# Patient Record
Sex: Female | Born: 1970 | Race: White | Hispanic: No | Smoking: Never smoker
Health system: Southern US, Community
[De-identification: ages and names within clinical notes are randomized; demographics above are authoritative.]

## PROBLEM LIST (undated history)

## (undated) DIAGNOSIS — N760 Acute vaginitis: Secondary | ICD-10-CM

## (undated) DIAGNOSIS — E039 Hypothyroidism, unspecified: Secondary | ICD-10-CM

## (undated) DIAGNOSIS — B9689 Other specified bacterial agents as the cause of diseases classified elsewhere: Secondary | ICD-10-CM

## (undated) DIAGNOSIS — T8859XA Other complications of anesthesia, initial encounter: Secondary | ICD-10-CM

## (undated) DIAGNOSIS — T4145XA Adverse effect of unspecified anesthetic, initial encounter: Secondary | ICD-10-CM

## (undated) DIAGNOSIS — N898 Other specified noninflammatory disorders of vagina: Secondary | ICD-10-CM

## (undated) DIAGNOSIS — F419 Anxiety disorder, unspecified: Secondary | ICD-10-CM

## (undated) HISTORY — PX: DILATION AND CURETTAGE OF UTERUS: SHX78

## (undated) HISTORY — DX: Acute vaginitis: N76.0

## (undated) HISTORY — PX: OTHER SURGICAL HISTORY: SHX169

## (undated) HISTORY — PX: ABDOMINAL HYSTERECTOMY: SHX81

## (undated) HISTORY — PX: TUBAL LIGATION: SHX77

## (undated) HISTORY — DX: Anxiety disorder, unspecified: F41.9

## (undated) HISTORY — DX: Other specified bacterial agents as the cause of diseases classified elsewhere: B96.89

## (undated) HISTORY — PX: ABDOMINAL HYSTERECTOMY: SUR658

## (undated) HISTORY — DX: Other specified noninflammatory disorders of vagina: N89.8

---

## 2001-09-21 ENCOUNTER — Other Ambulatory Visit: Admission: RE | Admit: 2001-09-21 | Discharge: 2001-09-21 | Payer: Self-pay | Admitting: Obstetrics and Gynecology

## 2001-10-05 ENCOUNTER — Ambulatory Visit (HOSPITAL_COMMUNITY): Admission: RE | Admit: 2001-10-05 | Discharge: 2001-10-05 | Payer: Self-pay | Admitting: Obstetrics & Gynecology

## 2001-10-28 ENCOUNTER — Inpatient Hospital Stay (HOSPITAL_COMMUNITY): Admission: RE | Admit: 2001-10-28 | Discharge: 2001-10-31 | Payer: Self-pay | Admitting: Obstetrics & Gynecology

## 2002-12-23 ENCOUNTER — Inpatient Hospital Stay (HOSPITAL_COMMUNITY): Admission: RE | Admit: 2002-12-23 | Discharge: 2002-12-26 | Payer: Self-pay | Admitting: Obstetrics & Gynecology

## 2002-12-23 ENCOUNTER — Encounter: Payer: Self-pay | Admitting: Obstetrics & Gynecology

## 2003-04-19 ENCOUNTER — Inpatient Hospital Stay (HOSPITAL_COMMUNITY): Admission: RE | Admit: 2003-04-19 | Discharge: 2003-04-25 | Payer: Self-pay | Admitting: Obstetrics & Gynecology

## 2006-12-24 ENCOUNTER — Ambulatory Visit (HOSPITAL_COMMUNITY): Admission: RE | Admit: 2006-12-24 | Discharge: 2006-12-24 | Payer: Self-pay | Admitting: Obstetrics & Gynecology

## 2007-02-23 ENCOUNTER — Inpatient Hospital Stay (HOSPITAL_COMMUNITY): Admission: RE | Admit: 2007-02-23 | Discharge: 2007-02-25 | Payer: Self-pay | Admitting: Obstetrics & Gynecology

## 2007-02-23 ENCOUNTER — Encounter: Payer: Self-pay | Admitting: Obstetrics & Gynecology

## 2011-01-22 NOTE — H&P (Signed)
NAME:  Kendra Knight, Kendra Knight NO.:  [id number]   MEDICAL RECORD NO.:  [id number]          PATIENT TYPE:  AMB   LOCATION:  DAY                           FACILITY:  [facility]   PHYSICIAN:  Lazaro Arms, M.D.   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  The  patient has a very interesting history dating back to January of 2003,  at which time she was pregnant and had a miscarriage, and I found she  had a large posterior uterine mass after that evaluation concluded.  I  did a laparoscopy and there was found to be a smooth muscle tumor or a  specifically spindle cell tumor arising from the uterosacral ligament  posteriorly.  I talked with Dr. Stanford Breed on several occasions at  that time and subsequently and had it re-looked at in biopsy, and it was  found to be benign with no malignant issues.  She subsequently got  pregnant again, and during the pregnancy a new mass began to grow.  We  did a cesarean section on December 23, 2002 because of an obstructive birth  canal because of this posterior mass.  Her surgery was uncomplicated at  that time.  We did do a tubal.  Her only postoperative problem was  respiratory suppression with IV narcotics necessitating admission to the  ICU for a few hours for respiratory support, but the mass continued to  stay large even after delivery.  As a result, we took her back in August  of 2004 and did a hysterectomy, again, under the consultation of Dr.  Stanford Breed.  Her sigmoid colon at that time was densely adherent to  it, and had to call Dr. Katrinka Blazing in even after a bowel prep, and she had a  small injury of the sigmoid which was repaired without difficulty.  Her  postoperative course was essentially unremarkable, except for a  bowel  decompression requirement.  Again, pathology returned as benign.  I saw  the patient again in April, at which time she stated she had been having  more abdominal pain, and on exam she had what I thought was about a 10 x  10 cm central abdominal mass arising from the pelvis, extending up into  the upper abdomen.  I did a CT scan which revealed a 14.7 x 9.8 x 9.8 cm  mass consistent with a recurrent benign spindle cell tumor.  There was  no evidence of malignancy.  I talked to Dr. Stanford Breed again, and he  thought a possibility was it could be hormonally response, and so he  recommended doing Lupron to see if it would shrink preoperatively, to  see if there was any reason to take ovaries out.  [name] is very opposed  to removing the ovaries, and, as a result of that, it really has not  responded, at least on the  first shot to Lupron.  We will see what it  looks like in the operating room at that time.  The patient also  understands that there is a limit to the number of surgeries that she  can have for this recurrence, and we may indeed have her see Dr. Kemper Durie-  Sharol Given.  She was adamant about not wanting to have the surgery done  anywhere else at this time, even though she knows there is a big chance  of having a bowel injury as a result of that.  She is bowel prepped  preoperatively, and Dr. Lovell Sheehan is going to be in the surgery with me  for that reason.   PAST MEDICAL HISTORY:  Negative except for the recurrent spindle cell  tumor.   PAST SURGERY:  1. She had a D&C in 1995.  2. Cryotherapy in 1993.  3. Laparoscopy in 2003 as stated above.  4. D&C in February of 2003 for the miscarriage.  5. Cesarean section in February of 2004 with tubal ligation.  6. Exploratory laparotomy with hysterectomy, excision of spindle cell      tumor, repair of bowel injury in August of 2004.   PAST OBSTETRIC HISTORY:  She has had 2 vaginal deliveries, a cesarean  section and 2 pregnancy  losses.   ALLERGIES:  1. CODEINE.  2. HYDROCODONE.  3. DARVOCET.  4. PENICILLIN.  5. STADOL.  6. ERYTHROMYCIN.  7. BUPRENEX caused respiratory depression.   CURRENT MEDICATIONS:  None, except for the Lupron on board, and she is  taking Effexor.  I have tried to ward off the perimenopausal symptoms  that would occur with it.  She has taken 150 mg a day.   REVIEW OF SYSTEMS:  Abdominal pain.  Otherwise negative.   PHYSICAL EXAMINATION:  HEENT:  Unremarkable.  Thyroid is normal.  LUNGS:  Clear.  HEART:  Regular rate and rhythm without murmurs, rubs, or gallops.  BREASTS:  Without mass, discharge or skin changes.  ABDOMEN:  A definitive mass you can palpate exteriorly up to the  umbilicus.  It is mobile.  PELVIC:  The pelvic exam again reveals this mass.  Adnexa is negative.  EXTREMITIES:  Warm with no edema.  NEUROLOGIC:  Grossly intact.   ASSESSMENT:  1. Recurrent spindle cell tumor.  2. Probable adherence to small bowel and large bowel with high chance      of possible need for significant bowel surgery intraoperatively.   PLAN:  The patient understands the risk that this incurs with coming  back to the OR again.  We did offer her to go ahead and have Dr. Stanford Breed operate on her.  She declined at this point.  As a result, I  have got Dr. Lovell Sheehan coming in.  We are doing a preoperative bowel prep  in anticipation of probable bowel injury due to this large recurrent  mass.  The patient understands those risks, and she will proceed.      Lazaro Arms, M.D.  Electronically Signed     LHE/MEDQ  D:  02/23/2007  T:  02/23/2007  Job:  [id number]

## 2011-01-22 NOTE — Op Note (Signed)
NAMEMERRICK, FEUTZ NO.:  [id number]   MEDICAL RECORD NO.:  [id number]          PATIENT TYPE:  INP   LOCATION:  A427                          FACILITY:  [facility]   PHYSICIAN:  Lazaro Arms, M.D.   DATE OF BIRTH:  16-Feb-1971   DATE OF PROCEDURE:  02/23/2007  DATE OF DISCHARGE:  02/25/2007                               OPERATIVE REPORT   PREOPERATIVE DIAGNOSES:  1. Recurrent pelvic mass, spindle cell tumor.  2. Previous severe adhesions with bowel injury to sigmoid.   POSTOPERATIVE DIAGNOSES:  1. Recurrent pelvic mass, spindle cell tumor.  2. Previous severe adhesions with bowel injury to sigmoid.  3. 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   SURGEON:  Eure.   ASSISTANT:  Jenkins.   FINDINGS:  Patient had had 3 surgeries in the past for recurrent spindle  cell tumors.  It was assumed this was a recurrence.  She had had her  last one 4 years ago.  The tumors had been found to be benign.  I talked  to Dr. Stanford Breed a couple of months prior to surgery.  We decided  to try Lupron and see if it would shrink it, it did not.  At time of  surgery today she had, it was really a cystic mass and it was full of  mucin.  Frozen section confirmed that but also confirmed spindle cell  tumor at the base of it; so, I am unsure about the final pathology.  I  reviewed the frozen with the pathologist.  Otherwise, there was no bowel  involvement today, it was retroperitoneal.   DESCRIPTION OF OPERATION:  The patient was taken to the operating room,  placed in the supine position, underwent general endotracheal  anesthesia, prepped and draped in the usual sterile fashion.  A  Pfannenstiel skin incision was made, carried down sharply to the rectus  fascia.  It was scored in the midline, extended  laterally.  The fascia  was taken off of the muscle superiorly and inferiorly without  difficulty.  The muscles were divided, peritoneal cavity was entered.  Dr. Lovell Sheehan and I just did manual retraction.  The mass was identified.  A lot of sharp and blunt dissection was performed to isolate the mass.  We identified both ureters, it took a great deal of time in tracking  them all the way down into the bladder, identified bowel and adhesions  of it to the bowel.  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  It  appeared to be mucin anyway and I sent that off for cytology.  There  really was no blood supply to this mass that we could find.  We  basically just took it off the vaginal cuff and then  I closed the cuff  back.  Again, we made sure the ureters were uninjured and no bowel  injury had occurred during the procedure either.  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  We reapproximated the  muscles and peritoneum, closed the fascia.  We made sure the subcu was  hemostatic and closed the skin with skin staples.  The patient tolerated  the procedure well.  She experienced 200 mL of blood loss.  Taken to the  recovery room in good stable condition.  All counts correct.      Lazaro Arms, M.D.  Electronically Signed     LHE/MEDQ  D:  04/03/2007  T:  04/03/2007  Job:  [id number]

## 2011-01-25 NOTE — H&P (Signed)
   NAME:  COURTENAY, HIRTH                           ACCOUNT NO.:  [id number]   MEDICAL RECORD NO.:  [id number]                   PATIENT TYPE:  AMB   LOCATION:  DAY                                  FACILITY:  [facility]   PHYSICIAN:  Lazaro Arms, M.D.                DATE OF BIRTH:  1971-07-19   DATE OF ADMISSION:  12/23/2002  DATE OF DISCHARGE:                                HISTORY & PHYSICAL   HISTORY OF PRESENT ILLNESS:  The patient is a 40 year old white female,  gravida 5, para 2, miscarriage 2, and living two children, with an estimated  date of delivery of January 05, 2003, currently at   DICTATION ENDED AT THIS POINT.                                               Lazaro Arms, M.D.    Loraine Maple  D:  12/22/2002  T:  12/23/2002  Job:  [id number]

## 2011-01-25 NOTE — H&P (Signed)
NAME:  Kendra Knight, Kendra Knight                           ACCOUNT NO.:  [id number]   MEDICAL RECORD NO.:  [id number]                   PATIENT TYPE:  AMB   LOCATION:  DAY                                  FACILITY:  [facility]   PHYSICIAN:  Lazaro Arms, M.D.                DATE OF BIRTH:  Aug 23, 1971   DATE OF ADMISSION:  12/23/2002  DATE OF DISCHARGE:                                HISTORY & PHYSICAL   HISTORY OF PRESENT ILLNESS:  The patient is a 40 year old white female,  gravida 5, para 2, abortus 2, with an estimated date of delivery of January 05, 2003 by last menstrual period and confirmatory 6-week sonogram,  currently at 38-1/7 weeks' gestation, who is admitted for a primary cesarean  section because of breech presentation.  To give complete background, the  patient underwent a miscarriage with a D&C approximately one year ago and a  short time thereafter was found to have a large posterior uterine mass which  turned out to be a cellular myoma of unknown potential.  She then got  pregnant again without difficulty.  That mass was removed laparoscopically,  but had to actually be taken out of the abdominal cavity through a small  lower incision after a failed posterior colpotomy.  She became pregnant and  pregnancy has progressed without difficulty, however, she has developed  another mass which I believe to be anterior -- it appears to be on  ultrasound and exam -- which is actually blocking the birth canal.  The baby  is in sort of a back-down/transverse oblique lie and I cannot even feel the  cervix, I think not giving any hope for a vaginal delivery; as a result, she  is admitted for a primary cesarean section.  The myoma was in fact arising  from the uterosacral ligament, posterior to the uterus -- it was not  attached to the uterus at all -- and that is why she otherwise would have  been a candidate for a vaginal delivery because no uterine incision was  made.   PAST MEDICAL HISTORY:   Past medical history significant only for cervical  dysplasia in the distant past.   PAST SURGICAL HISTORY:  She had a D&C in 1995, cryotherapy in 1993.  She had  the laparoscopy in February of last year.  She had the D&C also in February  of last year.   PAST OBSTETRICAL HISTORY:  Two pregnancy losses and two vaginal deliveries.   REVIEW OF SYSTEMS:  Review of systems otherwise negative.   ALLERGIES:  Her allergies are to CODEINE, HYDROCODONE, DARVOCET, PENICILLIN,  STADOL and ERYTHROMYCIN.   CURRENT MEDICATIONS:  Prenatal vitamins and occasional Fioricet for  headaches.   PHYSICAL EXAMINATION:  HEENT:  Unremarkable.  NECK:  Thyroid is normal.  LUNGS:  Lungs are clear.  HEART:  Heart has regular rhythm without murmurs, regurgitation or gallops.  BREASTS:  Deferred.  ABDOMEN:  Fundal height of 41 cm.  Cervix cannot be palpated.  The abdomen  is otherwise benign.  EXTREMITIES:  Extremities are warm with trace edema.  NEUROLOGIC:  Exam is grossly intact.   IMPRESSION:  1. Intrauterine pregnancy at 38-2/7 weeks' gestation.  2. Large anterior myoma.  3. Non-vertex oblique presentation.   PLAN:  The patient is admitted for a primary cesarean section and bilateral  tubal ligation at her request.  I told her that we probably would not remove  the myoma but, however, that if it was pedunculated or easily removed, that  it was a possibility, but that would be surgery-time decision.  If it is at  all embedded in the uterus, I told her that it is not appropriate to remove  it at the time of cesarean section because of blood supply issues and she  understands that.  She also understands the permanent nature of  sterilization.                                                Lazaro Arms, M.D.    Loraine Maple  D:  12/22/2002  T:  12/23/2002  Job:  [id number]

## 2011-01-25 NOTE — H&P (Signed)
[facility]  Patient:    Kendra Knight, Kendra Knight Visit Number: [id number] MRN: [id number]          Service Type: DSU Location: DAY Attending Physician:  Lazaro Arms Dictated by:   Duane Lope, M.D. Admit Date:  10/05/2001                           History and Physical  DATE OF BIRTH:  03-15-2071  HISTORY OF PRESENT ILLNESS:  The patient is a 40 year old white female, gravida 4, para 2, abortus 1, who states that she had a last menstrual period of July 15, 2001, which would give an estimated date of delivery of April 21, 2002.  She came in for a visit on September 08, 2001, and was found to only be 5 weeks and 6 days by that ultrasound, giving a delivery date of June 05, 2002.  Subsequently two weeks later on September 21, 2001, she had another sonogram just to confirm and she had a viable pregnancy consistent with 7 weeks and 5 days with positive fetal cardiac activity.  She had been doing well without any complaints.  She called me yesterday on call and stated that she had some pink discharge which was very minimal and she did not have any more.  She had a little bit more this morning.  We decided to proceed with a vaginal sonogram.  A vaginal probe sonogram was performed and revealed a shrinking embryonic pole down to 0.6 cm or 6 weeks and 4 days, irregular sac, no cardiac activity, and no yolk sac seen.  It was compared to her sonogram two weeks ago and it is certainly much smaller.  The impression is that this is a missed AB.  PAST MEDICAL HISTORY:  Significant only for some pregnancy-induced hypertension.  PAST SURGICAL HISTORY:  Negative.  PAST OBSTETRICAL HISTORY:  Two vaginal deliveries and one miscarriage.  ALLERGIES:  PENICILLIN, CODEINE, HYDROCODONE, DARVOCET, STADOL, and ERYTHROMYCIN.  MEDICATIONS:  Her only medication is prenatal vitamins.  SOCIAL HISTORY:  She is married and a homemaker.  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  REVIEW OF SYSTEMS:  Otherwise negative.  FAMILY HISTORY:  Noncontributory.  PHYSICAL EXAMINATION:  Her weight today is 151 pounds.  VITAL SIGNS:  The blood pressure is 110/60.  HEENT:  Unremarkable.  NECK:  The thyroid is normal.  LUNGS:  Clear.  HEART:  Regular rate and rhythm without murmurs, rubs, or gallops.  BREASTS:  Deferred.  ABDOMEN:  Benign.  No hepatosplenomegaly or masses.  Nontender.  PELVIC:  As per the vaginal probe sonogram results above.  EXTREMITIES:  Warm with no edema.  NEUROLOGIC:  Exam is grossly intact.  IMPRESSION:  Missed abortion at 9 weeks and 5 days, 7 weeks and 6 days size.  PLAN:  The patient is admitted for an outpatient suction and sharp uterine curettage and cervical dilatation for uterine evacuation of this miscarriage. The patient understands the risks and benefits and will proceed. Dictated by:   Duane Lope, M.D. Attending Physician:  Lazaro Arms DD:  10/05/01 TD:  10/05/01 Job: [id number]

## 2011-01-25 NOTE — Op Note (Signed)
[facility]  Patient:    Kendra Knight, Kendra Knight Visit Number: [id number] MRN: [id number]          Service Type: DSU Location: DAY Attending Physician:  Lazaro Arms Dictated by:   Duane Lope, M.D. Proc. Date: 10/05/01 Admit Date:  10/05/2001                             Operative Report  PREOPERATIVE DIAGNOSIS:  Missed abortion, first trimester.  POSTOPERATIVE DIAGNOSIS:  Missed abortion, first trimester.  OPERATION: 1. Cervical dilatation. 2. Suction uterine curettage. 3. Sharp uterine curettage.  SURGEON:  Duane Lope, M.D.  ANESTHESIA:  Mask.  FINDINGS:  The patient had been seen in the office earlier in the day and was found to have a nonviable pregnancy in the first trimester.  She would have been 9 weeks 5 days by dates based on a sonogram on 31 December; however, there was no fetal cardiac activity where there had been two weeks ago, and the pregnancy itself, the embryonic pole had actually shrunk.  DESCRIPTION OF PROCEDURE:  The patient was taken to the operating room and placed in the supine position where she underwent IV sedation.  She was then given propofol IV and given anesthesia by mask.  She was placed in the dorsolithotomy position.  The vagina and perineum were prepped using Betadine. The patient was draped in the usual sterile fashion.  Her bladder was drained with a red rubber catheter.  Speculum was placed, and the cervix was grasped using single-tooth tenaculum.  Hegar dilators were used, and the cervix was dilated serially without difficulty to allow the passage of a #8 curved suction curet.  Three passes were made with suction curet gently in a twisting manner, and all the tissue was removed with gentle sharp curettage using a #4 curet.  One more pass of suction curet was used, and all of this was done gently.  There was acceptable bleeding at the end of the case.  The patient was given methergine 0.2 mg IM.  The  single-tooth tenaculum was taken out. The speculum was removed.  The patient was stabilized and taken to the recovery room in good and stable condition.  She will be discharged home from the recovery room and followed up in the office in two weeks. Dictated by:   Duane Lope, M.D. Attending Physician:  Lazaro Arms DD:  10/05/01 TD:  10/05/01 Job: [id number]

## 2011-01-25 NOTE — H&P (Signed)
[facility]  Patient:    Kendra Knight, Kendra Knight Visit Number: [id number] MRN: [id number]          Service Type: DSU Location: DAY Attending Physician:  Lazaro Arms Dictated by:   Turner Daniels, M.D. Admit Date:  10/05/2001 Discharge Date: 10/05/2001                           History and Physical  HISTORY OF PRESENT ILLNESS:  Patient is a 40 year old white female gravida 4, para 2, abortus 2 who underwent a D&C for a missed abortion three weeks ago today.  Over the past week and a half or so has had increasing problems with pelvic pain, especially on the left side radiating up her left flank.  She had been seen in the office on the 10th and was complaining of some pain, but really nothing worse.  She is fairly stoic and I did not do a pelvic examination today because I had just done one a few days earlier.  Yesterday on pelvic examination she had what felt like a posterior cul-de-sac mass and a vaginal probe sonogram reveals about a 5.5 cm mass that looks hemorrhagic with some free fluid.  I cannot tell if it is coming from the left ovary but her symptoms are certainly from the left ovary.  Her endometrial stripe looks normal and the uterus otherwise looks normal.  As a result she is admitted for diagnostic laparoscopy and indicated procedures.  PAST MEDICAL HISTORY:  Significant only for pregnancy induced hypertension.  PAST SURGICAL HISTORY:  D&C.  PAST OBSTETRICAL HISTORY:  Two vaginal deliveries and now two miscarriages.  ALLERGIES:  PENICILLIN, CODEINE, HYDROCODONE, DARVOCET, STADOL, ERYTHROMYCIN.  MEDICATIONS:  Prenatal vitamins.  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  REVIEW OF SYSTEMS:  Otherwise negative.  FAMILY HISTORY:  Noncontributory.  PHYSICAL EXAMINATION  VITAL SIGNS:  Weight 150 pounds, blood pressure 100/60.  HEENT:  Unremarkable.  NECK:  Normal thyroid.  LUNGS:   Clear.  HEART:  Regular rate and rhythm without murmur, regurgitation, or gallop.  BREASTS:  Deferred.  ABDOMEN:  Benign except pain in the left lower quadrant.  No rebound.  No voluntary guarding.  No masses.  PELVIC:  As per the HPI.  EXTREMITIES:  Warm.  No edema.  NEUROLOGIC:  Grossly intact.  IMPRESSION:  Pelvic mass questionably coming from the left ovary, questionably a hemorrhagic corpus luteum of pregnancy.  PLAN:  Patient is admitted for a diagnostic laparoscopy indicated procedure. She understands the risks, benefits, alternatives, and will proceed. Dictated by:   Turner Daniels, M.D. Attending Physician:  Lazaro Arms DD:  10/28/01 TD:  10/28/01 Job: [id number]

## 2011-01-25 NOTE — Discharge Summary (Signed)
[facility]  Patient:    Kendra Knight, Kendra Knight Visit Number: [id number] MRN: [id number]          Service Type: MED Location: 4A A427 01 Attending Physician:  Lazaro Arms Dictated by:   Duane Lope, M.D. Admit Date:  10/28/2001 Discharge Date: 10/31/2001                             Discharge Summary  DISCHARGE DIAGNOSES: 1. Status post exploratory laparotomy after an operative laparoscopy    for removal of a pelvic mass, currently being read out as a pelvic    fibroma but with special stains and second opinion pending. 2. Unremarkable postoperative course.  PROCEDURE: 1. Operative laparoscopy with excision of pelvic mass. 2. Exploratory laparotomy in order to remove it from the abdomen. 3. Failed posterior colpotomy.  Please refer to the transcribed History and Physical for details of admission to the hospital.  HOSPITAL COURSE:  The patient was admitted after surgery. It was not anticipated that she would have to be admitted but having such a long surgery to remove the very large pelvic mass and in order not to break up the mass because I was not sure of its nature, I did essentially a minilaparotomy to remove the mass. Postoperatively the patient did well. She tolerated clear liquids and then regular diet. She voided without symptoms. Ambulated without complaints. Maintained stable vital signs. Her hemoglobin on postoperative day #1 was 11.4 and 32, and on postoperative day #3 it was 11 and 31.  Her white count was 4000. The patient had progression of normal bowel function with flatus. Her incisions remained clean, dry and intact without any evidence of hematoma or infection. She was discharged to home on the morning of postoperative day three in good stable condition. Follow up in the office in a week. She was given a prescription for Tylox #30 for pain and Toradol #20 also for pain. She has Phenergan at home if needed. And again, she will be seen  in the office in one week. She is given instructions and precautions for return prior to that time. Dictated by:   Duane Lope, M.D. Attending Physician:  Lazaro Arms DD:  10/31/01 TD:  10/31/01 Job: [id number]

## 2011-01-25 NOTE — Op Note (Signed)
NAME:  Kendra Knight, Kendra Knight                           ACCOUNT NO.:  [id number]   MEDICAL RECORD NO.:  [id number]                   PATIENT TYPE:  AMB   LOCATION:  DAY                                  FACILITY:  [facility]   PHYSICIAN:  Lazaro Arms, M.D.                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   POSTOPERATIVE DIAGNOSES:  1. 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  2. Repair of rectal bowel injury.   PROCEDURE:  1. Abdominal hysterectomy.  2. Excision of large pelvic mass posterior to the uterus, sidewall to     sidewall, and anterior abdominal wall way back into the sacral     promontory.  3. Repair of rectal bowel injury by Dr. Elpidio Anis.   SURGEON:  1. Lazaro Arms, M.D.  2. Dirk Dress. Katrinka Blazing, M.D. who came in and did the evaluation and repair of the     rectal bowel injury.   ANESTHESIA:  General endotracheal.   FINDINGS:  The patient was known to have a recurrent smooth muscle tumor of  the posterior cul-de-sac.  It originally was excised in February, 2003.  She  wanted another child and shortly thereafter got pregnant.  She had to have a  C-section because of an obstructed pelvis because of the recurrence of the  mass.  At the time of surgery, I knew that it was adherent to the sigmoid  colon.  I really could not see all the way down the backside, but I was  suspicious that it was densely adherent to the rectum.  That was confirmed  today.  The mass went up to the patient's umbilicus.  It completely filled  the pelvis and uterosacral promontory.  Her uterus was quite small, and it  went from sidewall to sidewall.  It was basically rotated 90 degrees towards  the anterior, and the sigmoid colon was like in the mid-pelvis from an  anterior to posterior standpoint, and then the rectum was densely  adherent  along the back wall of the mass.  In attempting to dissect the rectum off of  the mass, a small injury occured incidental to the dissection of the rectum  off of the mass.  The patient had been preoperatively bowel prepped.  I  called Dr. Katrinka Blazing in, and he evaluated the situation and felt that a primary  closure was appropriate.  The ovaries, otherwise, were normal.  There were  no other abnormalities of the pelvis or abdomen noted.   DESCRIPTION OF PROCEDURE:  The patient was taken to the operating room and  placed in the supine position where she underwent general endotracheal  anesthesia.  Her vagina was prepped in the usual sterile fashion, and a  Foley catheter was placed.  The abdomen was then prepped and draped in the  usual sterile fashion.   A Pfannenstiel skin incision was made in the same incision that I used for  her C-section back in April.  It was carried down to the rectus fascia,  scored in the midline, and extended laterally.  The muscles were divided.  The peritoneal cavity was entered without difficulty.  The course of the  mass filled the entire pelvis up to the sacral promontory, up to the  umbilicus, and sidewall to sidewall.  The anatomy was quite distorted, and  so initially I went ahead and got the utero-ovarian ligaments bilaterally.  They were clamped, cut, and double suture ligated.  I then got the uterine  vessels after skeletonization.  I then had to manually dissect the mass off  the pelvic sidewalls.  The sigmoid was loosely adherent, but then the rectum  was densely adherent to the mass and everything was rotated 90 degrees.  The  rectum was basically inseparable from the posterior vagina and the mass.  I  fairly easily dissected the sigmoid off of the uterus.  Below this point,  the rectum became just densely adherent and intimate with the posterior wall  of the mass and the vagina.  Basically, all three were indistinguishable.  I  picked a  plane that I thought was on the mass and in fact turned out to make  a small, approximately 3-cm injury into the rectum.  It was clean with no  spillage.  The patient had been preoperatively mechanically bowel prepped,  with good results.  She had nothing but clear water coming out at the end,  and there was no spillage at the time of surgery.  At this time, I called  Dr. Elpidio Anis in, and he evaluated the situation and felt that a primary  closure would be appropriate given the preoperative bowel prep and the area  of the injury.  We continued to dissect the rectum off at this point of the  mass and posterior vagina and then crossclamped the mass across the vagina.  The vagina was closed with interrupted figure-of-eight sutures.  Vaginal  angle sutures were placed bilaterally, and there was good hemostasis of  this.  Careful attention was paid not to injure the bladder because  everything was so attenuated by the mass and everything had been pulled up.  The vagina was very long and very attenuated, and the bladder was basically  up to the point of this part of the dissection, and great care was taken not  to injure the bladder.  We stayed medial to the uterine vessels, so ureteral  injury was not a concern.  The mass itself really had no specific blood  supply, but it sort of had vascularity from the peritoneal surfaces.  These  were treated with cautery and with individual sutures.  Dr. Katrinka Blazing then did  the primary closure of the rectal injury.  I will leave those details to his  operative report.  Basically he did a three-layer closure, the first two  layers using 3-0 Monocryl and the last being a 3-0 Prolene, with good  reapproximation and good serosal coverage of the area.  The pelvis was  irrigated vigorously.  Hemostasis was achieved again with individual sutures  and electrocautery unit.  Two JP drains were placed in each side of the rectum down into the cul-de-sac.  The mass went  all the way back down  basically to the bottom of the pelvis, but the peritoneum was hemostatic  here.  Intercede was placed over the vaginal cuff to keep the ovaries from  becoming adherent to them.  The two JP drains were taken out the right and  left lower quadrant.  The muscles were reapproximated loosely.  The fascia  was closed using 0 Vicryl running.  The subcutaneous tissue was made  hemostatic and irrigated.  The skin was closed using skin staples.   The patient tolerated the procedure well.  She experienced 500 cc of blood  loss.  She was taken to the recovery room in good and stable condition.  All  counts were correct x3.  She had two JP drains and an NG tube placed during  the procedure as well.  She received Ancef prophylactically, Levaquin 500 mg  and Flagyl 500 mg at the time of the bowel injury.  All specimens were sent  to the lab for evaluation.                                               Lazaro Arms, M.D.    Loraine Maple  D:  04/19/2003  T:  04/19/2003  Job:  [id number]

## 2011-01-25 NOTE — Op Note (Signed)
NAME:  Kendra Knight, Kendra Knight                           ACCOUNT NO.:  [id number]   MEDICAL RECORD NO.:  [id number]                   PATIENT TYPE:  AMB   LOCATION:  DAY                                  FACILITY:  [facility]   PHYSICIAN:  Lazaro Arms, M.D.                DATE OF BIRTH:  23-Jul-1971   DATE OF PROCEDURE:  12/23/2002  DATE OF DISCHARGE:                                 OPERATIVE REPORT   PREOPERATIVE DIAGNOSES:  1. Intrauterine pregnancy at 38-2/7 weeks.  2. Back down oblique lie.  3. Large anterior myoma.   POSTOPERATIVE DIAGNOSES:  1. Intrauterine pregnancy at 38-2/7 weeks.  2. Back down oblique lie.  3. No anterior myoma.  4. Large posterior peritoneal smooth-muscle tumor, recurrent, that basically     flipped anterior.   PROCEDURE:  Primary low transverse cesarean section with bilateral tubal  ligation.   SURGEON:  Lazaro Arms, M.D.   ANESTHESIA:  Spinal.   FINDINGS:  We delivered a viable female at 73 with Apgars of 9 and 9,  weighing 8 pounds 13.6 ounces.  There is three-vessel cord.  Placenta was  normal.  The uterus was somewhat rotated anteriorly because of the large  posterior mass.  The tubes and ovaries are normal, but we took pictures of  the mass.  It is a large, probably 15 x 24 cm posterior uterine mass,  arising from the same area that her smooth-muscled tumor was last year.  I  had an intraoperative evaluation by Gaylene Brooks, M.D.  He reviewed the  slides, looked at the mass, and we actually called John T. Kyla Balzarine, M.D. at  [facility].  I talked to De Blanch, M.D. last year regarding this  tumor and felt there was a good chance of local recurrence, but there was no  indication for anything more definitive at that time.  We agreed  intraoperatively that we would allow this thing to shrink down from the  pregnancy hormones and blood supply standpoint and come back and readdress  it in about 6-8 weeks.  The peritoneal cavity and the bowel and  everything  was otherwise normal.  There were no abnormalities that were suggestive of  frank malignancy.   DESCRIPTION OF OPERATION:  The patient was taken to the operating room and  underwent spinal anesthetic, placed in the dorsal lithotomy position.  She  had a bump under her right hip.  She was prepped and draped in the usual  sterile fashion.  A Foley catheter was placed.  A Pfannenstiel skin incision  was made and carried down sharply to the rectus fascia which was scored in  the midline and extended laterally.  The fascia was taken off the muscles  superiorly and inferiorly without difficulty.  The muscles were divided.  The peritoneal cavity was entered.  A low transverse hysterotomy incision  was made.  The bladder was  taken down without difficulty.  The infant was  converted to a footling breech presentation and delivered in a breech-  assisted manner without difficulty.  Three-vessel cord; it was clamped, cut.  Cord blood, cord gas was sent.  The infant was handed to [facility],  R.N., who was in attendance for routine neonatal resuscitation.  The  placenta was delivered spontaneously.  The uterus was delivered, and we  noted the mass.  We took pictures and measured it.  It was probably about 15-  16 cm x 24 cm.  In any event, the above-noted consult was done.  The uterus  was closed in two layers, first being a running interlocking layer, the  second being an embrocating layer.  Modified Pomeroy bilateral tubal  ligation was performed in the usual fashion.  The uterus was placed in the  peritoneal cavity.  The muscles reapproximated loosely.  The fascia was  closed using 0 Vicryl running.  Subcutaneous tissue was irrigated.  The skin  was closed using skin staples.  The patient tolerated the procedure well.  She experienced 800 mL of blood loss and was taken to the recovery room in  good stable condition.  All counts were correct x 3.                                                Lazaro Arms, M.D.    Loraine Maple  D:  12/23/2002  T:  12/23/2002  Job:  [id number]

## 2011-01-25 NOTE — Discharge Summary (Signed)
   NAME:  Kendra Knight, Kendra Knight                           ACCOUNT NO.:  [id number]   MEDICAL RECORD NO.:  [id number]                   PATIENT TYPE:  INP   LOCATION:  A428                                 FACILITY:  [facility]   PHYSICIAN:  Lazaro Arms, M.D.                DATE OF BIRTH:  [birth year]   DATE OF ADMISSION:  12/23/2002  DATE OF DISCHARGE:  12/26/2002                                 DISCHARGE SUMMARY   DISCHARGE DIAGNOSES:  1. Status post primary low transverse cesarean section for back-down oblique     lie.  2. Large posterior leiomyoma.  3. Respiratory depression requiring intubation secondary to narcotic     analgesia.   HISTORY OF PRESENT ILLNESS:  Please refer to the transcribed history and  physical and operative note for details of admission to the hospital.   HOSPITAL COURSE:  The patient was admitted postoperatively on the afternoon  of surgery.  She received 1 cc of Buprenex which was not adequate for her  pain.  She received another cc of Buprenex and underwent respiratory  depression.  Approximately two hours later, she required intubation and  transfer to the intensive care unit at which time the Buprenex was allowed  to wear off and she was weaned from the ventilator.  She was ambulatory,  tolerated clear liquids and a regular diet, voiding without symptoms.  Her  incision was clean, dry, and intact.  She had a good response to Tylox and  Motrin.  She was discharged home in _________, stable condition to follow up  in the office on Wednesday for routine followup.  She was given instructions  to call us or return prior to that time.                                               Lazaro Arms, M.D.    Loraine Maple  D:  12/26/2002  T:  12/26/2002  Job:  [id number]

## 2011-01-25 NOTE — Consult Note (Signed)
NAME:  Kendra Knight, Kendra Knight                           ACCOUNT NO.:  [id number]   MEDICAL RECORD NO.:  [id number]                   PATIENT TYPE:  INP   LOCATION:  IC07                                 FACILITY:  [facility]   PHYSICIAN:  Hanley Hays. Dechurch, M.D.           DATE OF BIRTH:  01-16-1971   DATE OF CONSULTATION:  12/23/2002  DATE OF DISCHARGE:                                   CONSULTATION   REFERRING PHYSICIAN:  Tilda Burrow, M.D./Luther Lauretta Chester, M.D.   REASON FOR CONSULTATION:  Ventilator management.   HISTORY OF PRESENT ILLNESS:  The patient is a 40 year old healthy white  female, status post C-section this a.m. , who received Buprenex (2 mL) at  1:30 and over the next 30 minutes was noted to have decreased  responsiveness, per her husband, and gasping respirations.  She had no  respiratory distress.  When nursing evaluated her, she was found to be near  apneic with poor ventilation.  A Code Blue was called.  The patient never  lost a pulse or blood pressure.  There was no respiratory distress prior to  the event suggesting other etiology, i.e., amniotic fluid embolism, PE, etc.  The patient's initial blood gas was 7.17, pCO2 41, pO2 386.  However, this  was done after several minutes of aggressive bagging; however, she did  receive bicarbonate during the episode.  The patient was transferred to the  ICU without event on a ventilator.   PAST MEDICAL HISTORY:  Gravida 2, para 2, A 0.  This was her first C-section  and tubal ligation.  The patient also had a tumor resected in 10/2001, which  was a benign soft tissue pelvic tumor.  Apparently, a similar episode  occurred after receiving Stadol.  History of reflux, otherwise healthy.  The patient also had a large 15 x 24  smooth muscle tumor noted during her C-section.   ALLERGIES:  1. STADOL.  2. DARVOCET.  3. PENICILLIN.  4. ERYTHROMYCIN.  5. CODEINE.   MEDICATIONS PRIOR TO ADMISSION:  1. Multivitamin.  2. Prenatal  vitamin.  3. Occasional Zantac.  4. Occasional Tylenol.   SOCIAL HISTORY:  The patient does not smoke.  She has two other children,  ages 20 and 71.  She is married.  She does not work outside of the home.   REVIEW OF SYSTEMS:  Unobtainable.  According to the husband, the patient had  no significant complaints prior to this delivery and this event.  He notes  that she has low threshold for pain.   PHYSICAL EXAMINATION:  GENERAL:  A well-developed, well-nourished female on  the ventilator who is unresponsive.  VITAL SIGNS:  Blood pressure is 110/70, pulse 110 and sinus rhythm.  LUNGS:  Clear to auscultation anteriorly and posteriorly.  Endotracheal tube  is in place.  HEART:  Regular, tachycardic, no murmur.  ABDOMEN:  Distended.  There are few bowel sounds.  Firm abdomen.  There is  some edema in the lower abdominal wall.  EXTREMITIES:  Lower extremities reveal sequential compression stockings, but  no edema.  Pulses are intact.  NEUROLOGIC:  She responds to deep pain, i.e., palpation of the uterus, but  otherwise does not respond.   LABORATORY DATA:  Chest x-ray reveals endotracheal tube placement  satisfactory.  There is a question of minimal infiltrates bilaterally  suggesting edema, though she does not examine to be such.  No laboratory  data is present at this time.   ASSESSMENT/PLAN:  1. Acute respiratory failure probably secondary to central nervous system     depression.  Would certainly avoid Buprenex, have written some     recommendations for pain medications.  Hopefully, if the patient become     more alert we will be able to extubate this evening.  2. Status post cesarean section.  The patient's postoperative care, of     course, will be deferred to the OB/GYN department.  3. Abdominal soft tissue mass.  Apparently, had a benign tumor one year ago     with recurrence.  She will be scheduled to undergo excision at a later     date.  4. History of central nervous system  depression with Stadol.  5. Reflux.  We will use some intensive care unit prophylaxis with Pepcid and     continue aggressive supportive care.   The patient's status, expectations, and prognosis were discussed with her  family.  She does withdraw to pain and moves all extremities.  I doubt there  is any neurologic deficit as this was a very short-lived event.  We will  monitor with you.  Thank you for this interesting consult.                                                Hanley Hays Josefine Class, M.D.    FED/MEDQ  D:  12/23/2002  T:  12/23/2002  Job:  [id number]

## 2011-01-25 NOTE — Discharge Summary (Signed)
NAME:  Kendra Knight, Kendra Knight                           ACCOUNT NO.:  [id number]   MEDICAL RECORD NO.:  [id number]                   PATIENT TYPE:  INP   LOCATION:  A419                                 FACILITY:  [facility]   PHYSICIAN:  Lazaro Arms, M.D.                DATE OF BIRTH:  April 13, 1971   DATE OF ADMISSION:  04/19/2003  DATE OF DISCHARGE:  04/25/2003                                 DISCHARGE SUMMARY   DISCHARGE DIAGNOSES:  1. Status post abdominal hysterectomy with resection of large pelvic soft     tissue mass.  2. Intraoperative rectal injury with intraoperative primary closure.  3. Otherwise unremarkable postoperative course.   HISTORY OF PRESENT ILLNESS:  Please refer to the transcribed history and  physical and operative report for details of the admission to the hospital.   HOSPITAL COURSE:  The patient was admitted after surgery with nasogastric  tube in place.  She had an incidental bowel injury at the time of surgery  down in the rectum because the soft tissue mass was densely adherent in this  area.  Dr. Katrinka Blazing did primary closure of this because of her preoperative  bowel prep.  The patient had an unremarkable postoperative course otherwise.  She tolerated her pain well and the transition from PCA to oral pain  medications well. She remained afebrile throughout the postoperative course.  She had diminishing white blood cell counts with appropriate dip.  She was  maintained on Rocephin and Cleocin postoperatively because of her  breastfeeding.  Her ______ were taken out postoperative day #5 as well as  her NG tube.  She began to pass flatus and have bowel movements and they  were removed at that point.  Her electrolytes remained stable as well as her  magnesium and calcium.  Her hemoglobin and hematocrit were also stable.  She  stayed in the hospital a total of 48 hours post NG tube removal and was  tolerating clear liquids and then regular diet.  She was discharged to  home  on p.o. Cleocin and Oxacillin.  The patient's incision was clean, dry and  intact.  Staples were left in place.  She was given strict instructions for  precautions prior to discharge regarding bowel function.  She was discharged  to home on Motrin, Augmentin and Cleocin.  She will be seen in the office  the following six days post discharge for follow up and she was given  instructions for precautions for return prior to that time.                                               Lazaro Arms, M.D.    Kendra Knight  D:  05/24/2003  T:  05/24/2003  Job:  [id number]

## 2011-01-25 NOTE — Op Note (Signed)
[facility]  Patient:    Kendra Knight, Kendra Knight Visit Number: [id number] MRN: [id number]          Service Type: DSU Location: DAY Attending Physician:  Lazaro Arms Dictated by:   Rockne Coons., M.D. Proc. Date: 10/28/01 Admit Date:  10/05/2001 Discharge Date: 10/05/2001                             Operative Report  PREOPERATIVE DIAGNOSES: 1. Pelvic mass. 2. Probable hematoma.  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  PROCEDURES: 1. Operative laparoscopy with excision of peritoneal fibroma versus    spindle cell tumor. 2. Attempted posterior colpotomy with removal of mass, failed. 3. A mini-laparotomy to remove the mass from the abdomen.  SURGEON:  Rockne Coons., M.D.  ANESTHESIA:  General endotracheal anesthesia.  FINDINGS:  Upon entering the abdomen and seeing the pelvis with the video laparoscope, it was noted that the uterus appeared to be grossly normal as were both ovaries and tubes.  There was no blood in the cul-de-sac or any blood at all in the pelvis or abdomen.  There was, however, a large, sort of multi-lobulated bulge posterior to the uterus between the uterus and the rectum that really went from pelvic sidewall to pelvic sidewall.  Both ureters could be seen easily as well as the inferior epigastric arteries.  This certainly was not a pelvic kidney since I could see the ureters.  I knew it was some sort of connective tissue mass.  I thought it might be a leiomyoma but indeed it was not attached to the uterus at all.  I got an intraoperative and almost postoperative frozen section and actually viewed the microscopy with Dr. Leretha Dykes and at this point it was felt to be a benign fibroma; however, there is some hypercellularity and there is a concern of a spindle cell tumor. We will await permanent sections for that information.  There were no  other abnormalities seen of the abdomen and pelvis.  DESCRIPTION OF PROCEDURE:  The patient was taken to the operating room, placed in the supine position where she underwent general endotracheal anesthesia. She was then placed in the dorsal lithotomy position and prepped and draped in the usual sterile fashion for a laparoscopic procedure.  I could not put a Hulka tenaculum on the cervix because the cervix was displaced so anteriorly. So I placed a sponge stick after having prepped the vagina and also placed a Foley catheter.  The patient had Flotrons placed in the preoperative area and also had 1 g of Ancef.  A semicircular incision was made under the umbilicus and a 10/11 trocar was placed in the peritoneal cavity under direct continuous visualization with the video laparoscope in the sheath.  Upward traction of the abdomen facilitated this and the peritoneal cavity was entered without difficulty.  The peritoneal cavity was insufflated.  A 5 mm trocar was then placed two fingerbreadths above the pubis under direct continuous visualization and the above-noted findings seen.  A 5 mm trocar was then placed in the right lower quadrant and subsequently the left lower quadrant and both were placed under direct continuous visualization without difficulty.  Blunt probes were used for traction and again the ureters and the pelvic vasculature was viewed directly. The posterior peritoneal reflection was then taken off with the electrocautery scissors.  A small area  was cauterized and then incised and a use of scissors to do a blunt dissection of the peritoneum off the mass.  It seemed that the more peritoneum I opened, the larger the mass seemed to be.  Great care was used.  The suction irrigation was used to also help dissect.  There was very little blood loss during this portion of the procedure.  Again, I was very particular to stay away from uterine vasculature, hypogastric arteries and  the ureters on both sides.  Most of the attention was done to the left.  I used traction and blunt dissection to shell out the soft tissue mass.  I actually got Dr. Lovell Sheehan to come in and view the early parts of the procedure to make sure that there was no involvement of the gastrointestinal system and he felt there was not and he helped as I dissected off the the mass off the posterior lower portion of the cervix and probably the vagina.  He actually did digital exam and we determined where the vagina began.  I continued to use traction and blunt dissection to remove the mass.  When I got the fibrous bands or bands that I felt might be vascular, I used electrocautery scissors for cutting.  After about an hour-and-a-half of this, the entire mass was removed and there was good hemostasis of all the pedicles and it felt indeed felt to be some sort of connective soft tissue mass.  We thought it might be a lipoma or a leiomyoma.  I then turned my attention to trying to remove the mass from the abdominal cavity and I knew it was too big to come through the umbilical incision and I did not want to increase the umbilical incision greatly for cosmetic reasons. As a result I thought I might try it through the posterior colpotomy incision. It was placed into the posterior cul-de-sac.  Attention was turned vaginally.  A reprep of the vagina was performed.  The cervix was grasped in the posterior lip of the cervix.  Enough retraction was placed.  The vagina was incised at the posterior reflection and I tried to enter the peritoneum but was unable to because of the distorted anatomy from where I peeled off this fibroma.  I found the peritoneum but was not able to enter because of poor visualization and again altered anatomy.  Close attention was paid to stay away from any injury to the rectum.  I then sewed the vaginal incision up with interrupted 0 Vicryl sutures and there was good hemostasis.  I  then decided to perform a mini-laparotomy incision two fingerbreadths below the pubis.  Incision was made and carried down sharply to the rectus fascia  which was scored in the midline and extended laterally.  The fascia was taken off the muscle superiorly and inferiorly without difficulty.  The muscles were divided.  The peritoneal cavity was entered.  I kept the mass in the endopouch and easily retrieved it through the incision.  I then used irrigation and suction to make sure the site was hemostatic.  Cautery was used in two small areas for peritoneal edge oozing.  There were no other abnormalities seen. Pictures were taken of the mass and they were sent to pathology for frozen section stated as above.  All pedicles were found to be hemostatic.  The muscles were reapproximated loosely.  The umbilical incision fascia was then closed and examined from inside the peritoneum with good repair of the defect.  The  fascia was closed using 0 Vicryl running.  Subcutaneous tissue was irrigated and made hemostatic.  In all of the incisions skin was closed using skin staples.  The patient tolerated the procedure well.  She experienced 150-200 cc of blood loss during the procedure.  She received an additional gram of Ancef prophylactically intraoperatively as well and was taken to the recovery room in good and stable condition.  All counts were correct x 3. Dictated by:   Rockne Coons., M.D. Attending Physician:  Lazaro Arms DD:  10/28/01 TD:  10/29/01 Job: [id number]

## 2011-01-25 NOTE — H&P (Signed)
NAME:  Kendra Knight, Kendra Knight NO.:  [id number]   MEDICAL RECORD NO.:  [id number]                   PATIENT TYPE:   LOCATION:                                       FACILITY:   PHYSICIAN:  Lazaro Arms, M.D.                DATE OF BIRTH:   DATE OF ADMISSION:  04/19/2003  DATE OF DISCHARGE:                                HISTORY & PHYSICAL   REASON FOR ADMISSION:  The patient is a 40 year old white female Gravida V,  Para 3, Abortus 2 who is admitted for an abdominal hysterectomy.  The  patient has a rather interesting history dating back to January 2003.  She  was pregnant, had a miscarriage and was found to have a large posterior  uterine mass thereafter.  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  She subsequent got pregnant again and  during the pregnancy a new mass began to grow.  We did a cesarean section on  her on December 23, 2002 because of an obstructed birth canal because of this  posterior mass.  Her surgery was uncomplicated and she had a tubal done at  the same time.  Her only problem was postoperative respiratory suppression  with IV narcotics necessitating an admission to the ICU for a few hours for  respiratory support.  The mass has continued to stay large.  It was in the  posterior wall of the uterus at the time of the cesarean section.  Her  sigmoid colon was found to be loosely adherent to it.  I have reviewed it  with Dr. Bascom Levels at the time of surgery originally in 2003 and again this  past April and our feeling is that after having reviewed it with Dr. Stanford Breed, there is really not a malignant potential there but it should be  removed. As a result she is admitted for an abdominal hysterectomy.  The  mass remains to the level of her umbilicus.   PAST MEDICAL HISTORY:  Negative.   PAST SURGICAL HISTORY:  1. D&C in 1995.  2. Cryotherapy in 1993.  3. Laparoscopy in February 2003.  4. D&C in February 2003.  5. Cesarean section in February along with tubal ligation.   PAST OB HISTORY:  She has two pregnancy losses, two vaginal deliveries and a  past cesarean section.   REVIEW OF SYMPTOMS:  Otherwise negative.   ALLERGIES:  1. CODEINE.  2. HYDROCODONE.  3. DARVOCET.  4. PENICILLIN.  5. STADOL.  6. ERYTHROMYCIN.  7. BUPRENEX.   CURRENT MEDICATIONS:  None.   PHYSICAL EXAMINATION:  HEENT:  Unremarkable.  The thyroid is normal.  LUNGS:  Clear.  HEART:  Regular rate and rhythm without murmurs, rubs or gallops.  BREASTS:  Without masses, discharge or skin changes.  ABDOMEN:  She has a mass up to the umbilicus.  PELVIC EXAM:  Confirms the mass.  Adnexa are negative.  EXTREMITIES:  Warm with no edema.  NEUROLOGIC:  Grossly intact.   IMPRESSION:  Enlarged smooth muscle tumor, recurrent.   PLAN:  The patient is admitted for an abdominal hysterectomy.  She  understands the risks, benefits, indications and alternatives to surgery and  we will proceed.  I indicated the risks of excessive bleeding and also  damage to the sigmoid colon and she is having a mechanical bowel prep  preoperatively.                                               Lazaro Arms, M.D.    Loraine Maple  D:  04/18/2003  T:  04/18/2003  Job:  [id number]

## 2011-01-25 NOTE — Discharge Summary (Signed)
NAMELEANDRIA, THIER NO.:  [id number]   MEDICAL RECORD NO.:  [id number]          PATIENT TYPE:  INP   LOCATION:  A427                          FACILITY:  [facility]   PHYSICIAN:  Lazaro Arms, M.D.   DATE OF BIRTH:  [birth year]   DATE OF ADMISSION:  02/23/2007  DATE OF DISCHARGE:  06/18/2008LH                               DISCHARGE SUMMARY   DISCHARGE DIAGNOSES:  1. Status post exploratory laparotomy with excision of pelvic mass.  2. Unremarkable postoperative course.   PROCEDURE:  As stated above.   Please refer to the history and physical and the operative report for  details of admission to the hospital.   HOSPITAL COURSE:  The patient was admitted postoperatively.  She did  very well, very quickly became ambulatory.  She tolerated clear liquids  and a regular diet, voided without symptoms.  Her incision was clean,  dry and intact.  Her abdominal exam was completely benign.  She was  voiding well.  She tolerated oral pain medicine.  Her hemoglobin and  hematocrit postoperatively were appropriate.  She was pre-op 13.6 and  she equilibrated down at 10.4 and 29.1.  She was discharged home on the  morning of postoperative day #2.   1. She is to follow up in the office next week to have her incision      evaluated.  2. She was given Dilaudid and Motrin for pain.  3. She was given instructions/precautions for return prior to that      time.      Lazaro Arms, M.D.  Electronically Signed     LHE/MEDQ  D:  04/03/2007  T:  04/03/2007  Job:  [id number]

## 2011-06-26 LAB — CBC
HCT: 30.4 — ABNORMAL LOW
Hemoglobin: 10.9 — ABNORMAL LOW
MCV: 87
Platelets: 200
Platelets: 228
RDW: 12
RDW: 12.1
WBC: 5.5

## 2011-06-26 LAB — DIFFERENTIAL
Basophils Absolute: 0
Basophils Absolute: 0
Basophils Relative: 0
Eosinophils Relative: 0
Lymphocytes Relative: 29
Monocytes Absolute: 0.6
Neutro Abs: 3.5
Neutro Abs: 8.3 — ABNORMAL HIGH
Neutrophils Relative %: 63

## 2011-06-27 LAB — COMPREHENSIVE METABOLIC PANEL
ALT: 15
Alkaline Phosphatase: 40
CO2: 30
Chloride: 104
Glucose, Bld: 86
Potassium: 4
Sodium: 138
Total Protein: 7.1

## 2011-06-27 LAB — DIFFERENTIAL
Basophils Relative: 1
Eosinophils Absolute: 0
Monocytes Relative: 7
Neutrophils Relative %: 52

## 2011-06-27 LAB — TYPE AND SCREEN: Antibody Screen: NEGATIVE

## 2011-06-27 LAB — CBC
Hemoglobin: 13.6
RBC: 4.35
RDW: 12.1
WBC: 4.1

## 2011-06-27 LAB — ABO/RH: ABO/RH(D): A POS

## 2013-03-01 ENCOUNTER — Telehealth: Payer: Self-pay | Admitting: *Deleted

## 2013-03-01 NOTE — Telephone Encounter (Signed)
Spoke with pt. ? Hot flashes, sides of breasts get hot and arms get hot and tingling. No chest pains. Appt scheduled to discuss hormones. Pt voiced understanding. JSY

## 2013-03-09 ENCOUNTER — Ambulatory Visit (INDEPENDENT_AMBULATORY_CARE_PROVIDER_SITE_OTHER): Payer: No Typology Code available for payment source | Admitting: Obstetrics & Gynecology

## 2013-03-09 ENCOUNTER — Encounter: Payer: Self-pay | Admitting: Obstetrics & Gynecology

## 2013-03-09 VITALS — BP 120/80 | Ht 65.0 in | Wt 127.0 lb

## 2013-03-09 DIAGNOSIS — N951 Menopausal and female climacteric states: Secondary | ICD-10-CM

## 2013-03-09 MED ORDER — ESTRADIOL 0.52 MG/0.87 GM (0.06%) TD GEL: 1.0000 "application " | Freq: Every day | TRANSDERMAL | Status: DC

## 2013-03-09 NOTE — Patient Instructions (Signed)

## 2013-03-10 ENCOUNTER — Telehealth: Payer: Self-pay | Admitting: Obstetrics & Gynecology

## 2013-03-10 MED ORDER — LEVOTHYROXINE SODIUM 25 MCG PO TABS: 25.0000 ug | ORAL_TABLET | Freq: Every day | ORAL | Status: DC

## 2013-03-10 NOTE — Telephone Encounter (Signed)
TSH reveals mild hypothyroidism, start synthroid 25 mics and titrate q 3 months

## 2013-03-10 NOTE — Progress Notes (Signed)
Patient ID: Kendra Knight, female   DOB: 12-30-1970, 42 y.o.   MRN: [id number] [name] is in with relatively new complaints of episodic hot flushes and associated feelings of panic or anxiety associated.  Just started recently. Never had them before. She describes them as sudden not associated and starts at breast line and goes up.  Will check TSH and start on elestrin 1 squirt a day for perimenopausal type symptoms.  If TSH elevated start low dose synthroid and titrate up

## 2013-03-25 ENCOUNTER — Other Ambulatory Visit: Payer: Self-pay | Admitting: *Deleted

## 2013-03-25 MED ORDER — LEVOTHYROXINE SODIUM 25 MCG PO TABS: 25.0000 ug | ORAL_TABLET | Freq: Every day | ORAL | Status: DC

## 2013-06-29 ENCOUNTER — Telehealth: Payer: Self-pay

## 2013-06-29 MED ORDER — METRONIDAZOLE 0.75 % VA GEL: VAGINAL | Status: DC

## 2013-06-29 NOTE — Telephone Encounter (Signed)
Pt informed Metrogel e-scribed.

## 2013-06-29 NOTE — Telephone Encounter (Signed)
Pt c/o milky white vaginal discharge with odor and itching, back ache. Pt states Dr. Despina Hidden and treated her for bacterial infections in the past.   Requesting med for bacterial infection.

## 2013-09-16 ENCOUNTER — Encounter: Payer: Self-pay | Admitting: Obstetrics & Gynecology

## 2013-09-16 ENCOUNTER — Ambulatory Visit (INDEPENDENT_AMBULATORY_CARE_PROVIDER_SITE_OTHER): Payer: No Typology Code available for payment source | Admitting: Obstetrics & Gynecology

## 2013-09-16 VITALS — BP 122/80 | Wt 137.0 lb

## 2013-09-16 DIAGNOSIS — E039 Hypothyroidism, unspecified: Secondary | ICD-10-CM

## 2013-09-16 DIAGNOSIS — N951 Menopausal and female climacteric states: Secondary | ICD-10-CM

## 2013-09-16 MED ORDER — ESTRADIOL 0.52 MG/0.87 GM (0.06%) TD GEL: 1.0000 "application " | Freq: Every day | TRANSDERMAL | Status: DC

## 2013-09-16 MED ORDER — SERTRALINE HCL 100 MG PO TABS: 100.0000 mg | ORAL_TABLET | Freq: Every day | ORAL | Status: DC

## 2013-09-16 NOTE — Progress Notes (Signed)
Patient ID: Kendra Knight, female   DOB: [birth year], 43 y.o.   MRN: [id number] See note below from last visit Pt states her hot flashes are much better Still on Synthroid 25 mics Recheck TSH today Follow up for yearly exam in 6 months Mammogram to be done  Kendra Knight is in with relatively new complaints of episodic hot flushes and associated feelings of panic or anxiety associated. Just started recently.  Never had them before.  She describes them as sudden not associated and starts at breast line and goes up.  Will check TSH and start on elestrin 1 squirt a day for perimenopausal type symptoms.  If TSH elevated start low dose synthroid and titrate up

## 2013-09-17 LAB — TSH: TSH: 3.061 u[IU]/mL (ref 0.350–4.500)

## 2013-11-17 ENCOUNTER — Encounter: Payer: Self-pay | Admitting: Adult Health

## 2013-11-17 ENCOUNTER — Ambulatory Visit (INDEPENDENT_AMBULATORY_CARE_PROVIDER_SITE_OTHER): Payer: No Typology Code available for payment source | Admitting: Adult Health

## 2013-11-17 ENCOUNTER — Encounter (INDEPENDENT_AMBULATORY_CARE_PROVIDER_SITE_OTHER): Payer: Self-pay

## 2013-11-17 VITALS — BP 116/78 | Ht 63.0 in | Wt 138.0 lb

## 2013-11-17 DIAGNOSIS — B9689 Other specified bacterial agents as the cause of diseases classified elsewhere: Secondary | ICD-10-CM

## 2013-11-17 DIAGNOSIS — N760 Acute vaginitis: Secondary | ICD-10-CM

## 2013-11-17 DIAGNOSIS — N898 Other specified noninflammatory disorders of vagina: Secondary | ICD-10-CM

## 2013-11-17 DIAGNOSIS — A499 Bacterial infection, unspecified: Secondary | ICD-10-CM

## 2013-11-17 HISTORY — DX: Other specified bacterial agents as the cause of diseases classified elsewhere: B96.89

## 2013-11-17 HISTORY — DX: Other specified noninflammatory disorders of vagina: N89.8

## 2013-11-17 LAB — POCT WET PREP (WET MOUNT)

## 2013-11-17 LAB — POCT URINALYSIS DIPSTICK
Blood, UA: NEGATIVE
Glucose, UA: NEGATIVE
LEUKOCYTES UA: NEGATIVE
NITRITE UA: NEGATIVE
PROTEIN UA: NEGATIVE

## 2013-11-17 MED ORDER — TINIDAZOLE 500 MG PO TABS: ORAL_TABLET | ORAL | Status: DC

## 2013-11-17 NOTE — Progress Notes (Signed)
Subjective:     Patient ID: Kendra Knight, female   DOB: [birth year], 43 y.o.   MRN: [id number]  HPI Kendra Knight is a 43 year old white female in complaining of vaginal discharge with odor and low back pain x 1 week.She said she has had BV several times in last year.  Review of Systems See HPI Reviewed past medical,surgical, social and family history. Reviewed medications and allergies.     Objective:   Physical Exam BP 116/78  Ht 5\' 3"  (1.6 m)  Wt 138 lb (62.596 kg)  BMI 24.45 kg/m2urine dipstick negative,Skin warm and dry.Pelvic: external genitalia is normal in appearance, vagina: white discharge with odor, cervix and uterus are absent, adnexa: no masses or tenderness noted. Wet prep: + for clue cells and +WBCs. Discussed changes in [place] and over growth of bacteria.    Assessment:     Vaginal discharge BV    Plan:     Rx Tinidazole 500 mg #8 take 4 now and 4 in am Review handout on BV Try luvena Follow up prn

## 2013-11-17 NOTE — Patient Instructions (Signed)
Bacterial Vaginosis Bacterial vaginosis is a vaginal infection that occurs when the normal balance of bacteria in the vagina is disrupted. It results from an overgrowth of certain bacteria. This is the most common vaginal infection in women of childbearing age. Treatment is important to prevent complications, especially in pregnant women, as it can cause a premature delivery. CAUSES  Bacterial vaginosis is caused by an increase in harmful bacteria that are normally present in smaller amounts in the vagina. Several different kinds of bacteria can cause bacterial vaginosis. However, the reason that the condition develops is not fully understood. RISK FACTORS Certain activities or behaviors can put you at an increased risk of developing bacterial vaginosis, including:  Having a new sex partner or multiple sex partners.  Douching.  Using an intrauterine device (IUD) for contraception. Women do not get bacterial vaginosis from toilet seats, bedding, swimming pools, or contact with objects around them. SIGNS AND SYMPTOMS  Some women with bacterial vaginosis have no signs or symptoms. Common symptoms include:  Grey vaginal discharge.  A fishlike odor with discharge, especially after sexual intercourse.  Itching or burning of the vagina and vulva.  Burning or pain with urination. DIAGNOSIS  Your health care provider will take a medical history and examine the vagina for signs of bacterial vaginosis. A sample of vaginal fluid may be taken. Your health care provider will look at this sample under a microscope to check for bacteria and abnormal cells. A vaginal pH test may also be done.  TREATMENT  Bacterial vaginosis may be treated with antibiotic medicines. These may be given in the form of a pill or a vaginal cream. A second round of antibiotics may be prescribed if the condition comes back after treatment.  HOME CARE INSTRUCTIONS   Only take over-the-counter or prescription medicines as  directed by your health care provider.  If antibiotic medicine was prescribed, take it as directed. Make sure you finish it even if you start to feel better.  Do not have sex until treatment is completed.  Tell all sexual partners that you have a vaginal infection. They should see their health care provider and be treated if they have problems, such as a mild rash or itching.  Practice safe sex by using condoms and only having one sex partner. SEEK MEDICAL CARE IF:   Your symptoms are not improving after 3 days of treatment.  You have increased discharge or pain.  You have a fever. MAKE SURE YOU:   Understand these instructions.  Will watch your condition.  Will get help right away if you are not doing well or get worse. FOR MORE INFORMATION  Centers for Disease Control and Prevention, Division of STD Prevention: Appraiser[employer]raud[employer]fi American Sexual Health Association (ASHA): www.ashastd.org  Document Released: 08/26/2005 Document Revised: 06/16/2013 Document Reviewed: 04/07/2013 [facility] Patient Information 2014 [place]. Take tindimax Try luvena

## 2014-01-11 ENCOUNTER — Telehealth: Payer: Self-pay

## 2014-01-11 MED ORDER — TINIDAZOLE 500 MG PO TABS: ORAL_TABLET | ORAL | Status: DC

## 2014-01-11 NOTE — Telephone Encounter (Signed)
Has BV wants meds refilled, do not use salts or bubbles in tub just plain water, refilled tindamax

## 2014-04-14 ENCOUNTER — Other Ambulatory Visit: Payer: Self-pay | Admitting: Obstetrics & Gynecology

## 2014-05-19 ENCOUNTER — Telehealth: Payer: Self-pay | Admitting: Obstetrics & Gynecology

## 2014-05-19 MED ORDER — ESTRADIOL 0.52 MG/0.87 GM (0.06%) TD GEL: 1.0000 "application " | Freq: Every day | TRANSDERMAL | Status: DC

## 2014-05-19 NOTE — Telephone Encounter (Signed)
Pt requesting refill for the estradiol 0.06% gel.

## 2014-05-19 NOTE — Telephone Encounter (Signed)
Pt informed Estradiol gel e-scribed.

## 2014-06-15 ENCOUNTER — Telehealth: Payer: Self-pay | Admitting: Adult Health

## 2014-06-15 MED ORDER — TINIDAZOLE 500 MG PO TABS: ORAL_TABLET | ORAL | Status: DC

## 2014-06-15 NOTE — Telephone Encounter (Signed)
Complains of BV will refill tindamax

## 2014-07-11 ENCOUNTER — Encounter: Payer: Self-pay | Admitting: Adult Health

## 2014-09-11 ENCOUNTER — Other Ambulatory Visit: Payer: Self-pay | Admitting: Obstetrics & Gynecology

## 2015-06-15 ENCOUNTER — Other Ambulatory Visit: Payer: Self-pay | Admitting: Obstetrics & Gynecology

## 2015-10-23 ENCOUNTER — Other Ambulatory Visit: Payer: Self-pay | Admitting: Obstetrics & Gynecology

## 2016-07-12 ENCOUNTER — Other Ambulatory Visit: Payer: Self-pay | Admitting: Obstetrics & Gynecology

## 2016-07-25 ENCOUNTER — Encounter: Payer: Self-pay | Admitting: Obstetrics & Gynecology

## 2016-07-25 ENCOUNTER — Ambulatory Visit (INDEPENDENT_AMBULATORY_CARE_PROVIDER_SITE_OTHER): Payer: PRIVATE HEALTH INSURANCE | Admitting: Obstetrics & Gynecology

## 2016-07-25 ENCOUNTER — Encounter (HOSPITAL_COMMUNITY): Payer: Self-pay

## 2016-07-25 VITALS — BP 150/90 | HR 72 | Ht 63.0 in | Wt 146.0 lb

## 2016-07-25 DIAGNOSIS — R1900 Intra-abdominal and pelvic swelling, mass and lump, unspecified site: Secondary | ICD-10-CM | POA: Diagnosis not present

## 2016-07-25 DIAGNOSIS — D481 Neoplasm of uncertain behavior of connective and other soft tissue: Secondary | ICD-10-CM | POA: Diagnosis not present

## 2016-07-25 DIAGNOSIS — R030 Elevated blood-pressure reading, without diagnosis of hypertension: Secondary | ICD-10-CM

## 2016-07-25 DIAGNOSIS — D4819 Other specified neoplasm of uncertain behavior of connective and other soft tissue: Secondary | ICD-10-CM

## 2016-07-25 NOTE — Progress Notes (Signed)
Chief Complaint  Patient presents with  . pain lt side of stomach    x 2 week    Blood pressure (!) 150/90, pulse 72, height 5\' 3"  (1.6 m), weight 146 lb (66.2 kg).  45 y.o. [id number] No LMP recorded. Patient has had a hysterectomy. The current method of family planning is status post hysterectomy.  Outpatient Encounter Prescriptions as of 07/25/2016  Medication Sig  . levothyroxine (SYNTHROID, LEVOTHROID) 25 MCG tablet TAKE 1 TABLET DAILY BEFORE BREAKFAST  . sertraline (ZOLOFT) 100 MG tablet TAKE 1 TABLET DAILY  . [DISCONTINUED] Estradiol 0.52 MG/0.87 GM (0.06%) GEL Apply 1 application topically daily.  . [DISCONTINUED] tinidazole (TINDAMAX) 500 MG tablet Take 4 today and 4 in am   No facility-administered encounter medications on file as of 07/25/2016.     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, 2003, 2004 x 2, 2008 We have known that she has had a palpable mass in the pelvis for a number of years but she has been relatively asymptomatic Her last CT was in 2008 before the last exp lap and surgery I did on her  Objective Abdomen soft minimally distended, tender moderate LLQ no rebound Pelvic vagina intact cervix surgically absent palapble mass still in the pelvis extending to the left feels much as it has over the past several years but patient is much more tender than previous years, whereas before she has never had pain with exam since 2008  Pertinent ROS No burning with urination, frequency or urgency No nausea, vomiting or diarrhea Nor fever chills or other constitutional  symptoms   Labs or studies     Impression Diagnoses this Encounter::   ICD-9-CM ICD-10-CM   1. Pelvic mass in female 789.30 R19.00 CT ABDOMEN PELVIS W CONTRAST  2. 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   Labs or Scans Ordered: Orders Placed This Encounter  Procedures  . CT ABDOMEN PELVIS W CONTRAST    Management:: Will need to evaluate mass and if has grown or changed causing patient's symptoms, CT scan of abdomen and pelvis is ordered for tomorrow  Follow up Return if symptoms worsen or fail to improve.  Will follow up the results of her CT scan       All questions were answered.  Past Medical History:  Diagnosis Date  . Anxiety   . BV (bacterial vaginosis) 11/17/2013  . Vaginal discharge 11/17/2013    Past Surgical History:  Procedure Laterality Date  . ABDOMINAL HYSTERECTOMY    . ABDOMINAL HYSTERECTOMY     ABDOMINAL HYSTERECTOMY  . CROTHERAPY    . DILATION AND CURETTAGE OF UTERUS    . TUBAL LIGATION      OB History    Gravida Para Term Preterm AB Living   5 3     2 3    SAB TAB Ectopic Multiple Live Births   2  3      Allergies  Allergen Reactions  . Codeine Nausea And Vomiting  . Darvocet [Propoxyphene N-Acetaminophen] Nausea And Vomiting  . Erythromycin Nausea And Vomiting  . Hydrocodone Nausea And Vomiting  . Prednisone   . Stadol [Butorphanol] Rash    Social History   Social History  . Marital status: Married    Spouse name: N/A  . Number of children: N/A  . Years of education: N/A   Social History Main Topics  . Smoking status: Never Smoker  . Smokeless tobacco: Never Used  . Alcohol use No  . Drug use: No  . Sexual activity: Yes    Birth control/ protection: Surgical   Other Topics Concern  . None   Social History Narrative   . None    Family History  Problem Relation Age of Onset  . Hypertension Mother   . Heart disease Brother   . Cancer Maternal Uncle     brain  . Heart disease Maternal Grandfather   . Heart disease Paternal Grandfather     heart attack

## 2016-07-26 ENCOUNTER — Ambulatory Visit (HOSPITAL_COMMUNITY)
Admission: RE | Admit: 2016-07-26 | Discharge: 2016-07-26 | Disposition: A | Discharge status: Home / Self Care | Payer: PRIVATE HEALTH INSURANCE | Source: Ambulatory Visit | Attending: Obstetrics & Gynecology | Admitting: Obstetrics & Gynecology

## 2016-07-26 DIAGNOSIS — Z90710 Acquired absence of both cervix and uterus: Secondary | ICD-10-CM | POA: Diagnosis not present

## 2016-07-26 DIAGNOSIS — R1900 Intra-abdominal and pelvic swelling, mass and lump, unspecified site: Secondary | ICD-10-CM | POA: Diagnosis present

## 2016-07-26 DIAGNOSIS — R1904 Left lower quadrant abdominal swelling, mass and lump: Secondary | ICD-10-CM | POA: Diagnosis not present

## 2016-07-26 DIAGNOSIS — K7689 Other specified diseases of liver: Secondary | ICD-10-CM | POA: Insufficient documentation

## 2016-07-26 MED ORDER — IOPAMIDOL (ISOVUE-300) INJECTION 61%
100.0000 mL | Freq: Once | INTRAVENOUS | Status: AC | PRN
  Administered 2016-07-26: 100 mL via INTRAVENOUS

## 2016-07-31 ENCOUNTER — Telehealth: Payer: Self-pay | Admitting: Gynecologic Oncology

## 2016-07-31 ENCOUNTER — Telehealth: Payer: Self-pay | Admitting: *Deleted

## 2016-07-31 ENCOUNTER — Other Ambulatory Visit: Payer: Self-pay | Admitting: Obstetrics & Gynecology

## 2016-07-31 MED ORDER — KETOROLAC TROMETHAMINE 10 MG PO TABS: 10.0000 mg | ORAL_TABLET | Freq: Three times a day (TID) | ORAL | 1 refills | Status: DC | PRN

## 2016-07-31 NOTE — Telephone Encounter (Signed)
Spoke with the patient about her appointment to see Dr. Denman George on December 6 at 10:45am.  Referral was placed by Dr. Elonda Husky for recurrent pelvic mass.  Directions discussed and contact information for the office given.  Patient advised to call for any needs.  We will contact her with a sooner appointment if we get a cancellation.

## 2016-07-31 NOTE — Telephone Encounter (Signed)
Left message that prescription was sent to pharmacy.

## 2016-08-06 ENCOUNTER — Other Ambulatory Visit: Payer: No Typology Code available for payment source | Admitting: Obstetrics & Gynecology

## 2016-08-14 ENCOUNTER — Encounter: Payer: Self-pay | Admitting: Gynecologic Oncology

## 2016-08-14 ENCOUNTER — Encounter: Payer: Self-pay | Admitting: Obstetrics & Gynecology

## 2016-08-14 ENCOUNTER — Ambulatory Visit: Payer: PRIVATE HEALTH INSURANCE | Attending: Gynecologic Oncology | Admitting: Gynecologic Oncology

## 2016-08-14 VITALS — BP 154/86 | HR 69 | Temp 98.4°F | Resp 18 | Ht 63.0 in | Wt 147.9 lb

## 2016-08-14 DIAGNOSIS — D499 Neoplasm of unspecified behavior of unspecified site: Secondary | ICD-10-CM | POA: Insufficient documentation

## 2016-08-14 DIAGNOSIS — F419 Anxiety disorder, unspecified: Secondary | ICD-10-CM | POA: Insufficient documentation

## 2016-08-14 DIAGNOSIS — R1900 Intra-abdominal and pelvic swelling, mass and lump, unspecified site: Secondary | ICD-10-CM

## 2016-08-14 DIAGNOSIS — J029 Acute pharyngitis, unspecified: Secondary | ICD-10-CM | POA: Diagnosis not present

## 2016-08-14 MED ORDER — AMOXICILLIN 250 MG PO CAPS: 250.0000 mg | ORAL_CAPSULE | Freq: Three times a day (TID) | ORAL | 0 refills | Status: DC

## 2016-08-14 NOTE — Patient Instructions (Signed)
Preparing for your Surgery  Plan for surgery on January 11 with Dr. Everitt Amber at [place] will be scheduled for an exploratory laparotomy, resection of a pelvic mass, possible oophorectomy, possible bowel resection.  Pre-operative Testing -You will receive a phone call from presurgical testing at [facility] to arrange for a pre-operative testing appointment before your surgery.  This appointment normally occurs one to two weeks before your scheduled surgery.   -Bring your insurance card, copy of an advanced directive if applicable, medication list  -At that visit, you will be asked to sign a consent for a possible blood transfusion in case a transfusion becomes necessary during surgery.  The need for a blood transfusion is rare but having consent is a necessary part of your care.     -You should not be taking blood thinners or aspirin at least ten days prior to surgery unless instructed by your surgeon.  -You will need to drink two bottles of magnesium citrate the day before surgery starting at 9 am or 10 am to clean out your bowels.  Day Before Surgery at [place] will be asked to take in a light diet the day before surgery.  Avoid carbonated beverages.  You will be advised to have nothing to eat or drink after midnight the evening before.     Eat a light diet the day before surgery.  Examples including soups, broths, toast, yogurt, mashed potatoes.  Things to avoid include carbonated beverages (fizzy beverages), raw fruits and raw vegetables, or beans.    If your bowels are filled with gas, your surgeon will have difficulty visualizing your pelvic organs which increases your surgical risks.  Your role in recovery Your role is to become active as soon as directed by your doctor, while still giving yourself time to heal.  Rest when you feel tired. You will be asked to do the following in order to speed your recovery:  - Cough and breathe deeply.  This helps toclear and expand your lungs and can prevent pneumonia. You may be given a spirometer to practice deep breathing. A staff member will show you how to use the spirometer. - Do mild physical activity. Walking or moving your legs help your circulation and body functions return to normal. A staff member will help you when you try to walk and will provide you with simple exercises. Do not try to get up or walk alone the first time. - Actively manage your pain. Managing your pain lets you move in comfort. We will ask you to rate your pain on a scale of zero to 10. It is your responsibility to tell your doctor or nurse where and how much you hurt so your pain can be treated.  Special Considerations -If you are diabetic, you may be placed on insulin after surgery to have closer control over your blood sugars to promote healing and recovery.  This does not mean that you will be discharged on insulin.  If applicable, your oral antidiabetics will be resumed when you are tolerating a solid diet.  -Your final pathology results from surgery should be available by the Friday after surgery and the results will be relayed to you when available.   Blood Transfusion Information WHAT IS A BLOOD TRANSFUSION? A transfusion is the replacement of blood or some of its parts. Blood is made up of multiple cells which provide different functions.  Red blood cells carry oxygen and are used for blood loss replacement.  White blood cells fight against infection.  Platelets control bleeding.  Plasma helps clot blood.  Other blood products are available for specialized needs, such as hemophilia or other clotting disorders. BEFORE THE TRANSFUSION  Who gives blood for transfusions?   You may be able to donate blood to be used at a later date on yourself (autologous donation).  Relatives can be asked to donate blood. This is generally not any safer than if you have received blood from a stranger. The same  precautions are taken to ensure safety when a relative's blood is donated.  Healthy volunteers who are fully evaluated to make sure their blood is safe. This is blood bank blood. Transfusion therapy is the safest it has ever been in the practice of medicine. Before blood is taken from a donor, a complete history is taken to make sure that person has no history of diseases nor engages in risky social behavior (examples are intravenous drug use or sexual activity with multiple partners). The donor's travel history is screened to minimize risk of transmitting infections, such as malaria. The donated blood is tested for signs of infectious diseases, such as HIV and hepatitis. The blood is then tested to be sure it is compatible with you in order to minimize the chance of a transfusion reaction. If you or a relative donates blood, this is often done in anticipation of surgery and is not appropriate for emergency situations. It takes many days to process the donated blood. RISKS AND COMPLICATIONS Although transfusion therapy is very safe and saves many lives, the main dangers of transfusion include:   Getting an infectious disease.  Developing a transfusion reaction. This is an allergic reaction to something in the blood you were given. Every precaution is taken to prevent this. The decision to have a blood transfusion has been considered carefully by your caregiver before blood is given. Blood is not given unless the benefits outweigh the risks.

## 2016-08-14 NOTE — Progress Notes (Signed)
Consult Note: Gyn-Onc  Consult was requested by Dr. Elonda Husky for the evaluation of Kendra Knight 45 y.o. female  CC:  Chief Complaint  Patient presents with  . pelvic mass    Assessment/Plan:  [name]  is a 45 y.o.  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  I reviewed her CT images with the patient from 07/26/16. I discussed that I recommend its removal given its size and its symptomatic.  I discussed that resection would require ex lap via a midline vertical incision with resection of the mass, possible oophorectomy, lysis of adhesions, possible small bowel or colonic resection and possible stoma. I discussed that I recommended BSO to reduce the likelihood of recurrence, however the patient strongly desires to keep her ovaries provided they are normal in appearance (at least one).  I discussed that her surgery would be associated with substantial risk given the size and complexity of the mass and the history of multiple prior surgeries. I discussed operative risks including  bleeding, infection, damage to internal organs (such as bladder,ureters, bowels), blood clot, reoperation and rehospitalization. I discussed that if colonic resection is necessary she may require stoma formation temporarily. I discussed that urologic surgery (such as ureteral re-implantation or bladder repair) may also be necessary.  She would like to proceed in early January as she has a court date prior to that time.  I discussed anticipated hospital stay and recovery and restrictions.   She has a sore throat and requested empiric amoxacillin.Likely viral infection.   HPI: Kendra Knight is a 45 year old parous woman who is seen in consultation at the request of Dr Elonda Husky for a recurrent cystic pelvic mass. 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 She had a hysterectomy approximately 13 years ago. Her  surgeyr in 2004 was associated with a bowel injury to the sigmoid colon requiring resection and anastamosis.   The pathology from the 2008 surgery revealed a mass consisting of ovarian type stroma and the lumen of the cystic cavity is lined by non-atypical mucinous epithelium. A portion of the mass had areas of benign smooth muscle. The ovaries were grossly not involved or attached to the mass. 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   The patient began experiencing fullness and pressure and in October, 2017 began appreciated left sided flank pains. She was suspicious about recurrence of her cystadenoma as was seen by Dr Elonda Husky who performed CT abdo/pelvis on 07/26/16. This showed a multilobulated cystic mass with multiple thin enhancing septations in the left lower quadrant, left adnexa and central pelvis. It measured 16.8x9.6x12.1cm. 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   Current Meds:  Outpatient Encounter Prescriptions as of 08/14/2016  Medication Sig  . ketorolac (TORADOL) 10 MG tablet Take 1 tablet (10 mg total) by mouth every 8 (eight) hours as needed.  Marland Kitchen levothyroxine (SYNTHROID, LEVOTHROID) 25 MCG tablet TAKE 1 TABLET DAILY BEFORE BREAKFAST  . sertraline (ZOLOFT) 100 MG tablet TAKE 1 TABLET DAILY  . amoxicillin (AMOXIL) 250 MG capsule Take 1 capsule (250 mg total) by mouth 3 (three) times daily.   No facility-administered encounter medications on file as of 08/14/2016.     Allergy:  Allergies  Allergen Reactions  . Buprenex [Buprenorphine] Anaphylaxis  Pt had Acute Respiratory Failure with this drug x2  . Codeine Nausea And Vomiting  . Darvocet [Propoxyphene N-Acetaminophen] Nausea And Vomiting  . Erythromycin Nausea And Vomiting  . Hydrocodone Nausea And Vomiting  . Prednisone   . Stadol [Butorphanol] Rash     Social Hx:   Social History   Social History  . Marital status: Married    Spouse name: N/A  . Number of children: N/A  . Years of education: N/A   Occupational History  . Not on file.   Social History Main Topics  . Smoking status: Never Smoker  . Smokeless tobacco: Never Used  . Alcohol use Not on file  . Drug use: Unknown  . Sexual activity: Yes    Birth control/ protection: Surgical   Other Topics Concern  . Not on file   Social History Narrative  . No narrative on file    Past Surgical Hx:  Past Surgical History:  Procedure Laterality Date  . ABDOMINAL HYSTERECTOMY    . ABDOMINAL HYSTERECTOMY     ABDOMINAL HYSTERECTOMY  . CROTHERAPY    . DILATION AND CURETTAGE OF UTERUS    . TUBAL LIGATION      Past Medical Hx:  Past Medical History:  Diagnosis Date  . Anxiety   . BV (bacterial vaginosis) 11/17/2013  . Vaginal discharge 11/17/2013    Past Gynecological History:  Cesarean section x 2 No LMP recorded. Patient has had a hysterectomy.  Family Hx:  Family History  Problem Relation Age of Onset  . Hypertension Mother   . Heart disease Brother   . Cancer Maternal Uncle     brain  . Heart disease Maternal Grandfather   . Heart disease Paternal Grandfather     heart attack    Review of Systems:  Constitutional  Feels well,    ENT + sore throat Skin/Breast  No rash, sores, jaundice, itching, dryness Cardiovascular  No chest pain, shortness of breath, or edema  Pulmonary  No cough or wheeze.  Gastro Intestinal  No nausea, vomitting, or diarrhoea. No bright red blood per rectum, no abdominal pain, change in bowel movement, or constipation.  Genito Urinary  No frequency, urgency, dysuria, no bleeding, Musculo Skeletal  No myalgia, arthralgia, joint swelling or pain  Neurologic  No weakness, numbness, change in gait,  Psychology  No depression, anxiety, insomnia.   Vitals:  Blood pressure (!) 154/86, pulse 69, temperature 98.4 F (36.9  C), temperature source Oral, resp. rate 18, height 5\' 3"  (1.6 m), weight 147 lb 14.4 oz (67.1 kg), SpO2 100 %.  Physical Exam: WD in NAD Neck  Mild tonsilar erythema without exudate Lymph Node Survey No cervical supraclavicular or inguinal adenopathy Cardiovascular  Pulse normal rate, regularity and rhythm. S1 and S2 normal.  Lungs  Clear to auscultation bilateraly, without wheezes/crackles/rhonchi. Good air movement.  Skin  No rash/lesions/breakdown  Psychiatry  Alert and oriented to person, place, and time  Abdomen  Normoactive bowel sounds, abdomen soft, non-tender and thin without evidence of hernia.  The mass is not appreciated well on abdominal exam. Back No CVA tenderness Genito Urinary  Vulva/vagina: Normal external female genitalia.  No lesions. No discharge or bleeding.  Bladder/urethra:  No lesions or masses, well supported bladder  Vagina: normal  Cervix and uterus surgically absent  Adnexa: cystic fullness at the vaginal cuff appreciated. Somewhat mobile, very compressible.  Rectal  Good tone, no masses no cul de sac nodularity.  Extremities  No bilateral cyanosis, clubbing or  edema.   Donaciano Eva, MD  08/14/2016, 1:52 PM

## 2016-08-14 NOTE — Consult Note (Signed)
Dr Denman George,   please find below a chronological list of all of the surgeries I have done on Kendra Knight over the years, because they were all before EPIC it takes time to collate them all.  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    Her most recent CT scan reveals what appears to be a recurrence of this cystadenoma but, at this point, who knows really.  Hope this helps and thanks so much for seeing Rucha.  Dr Elonda Husky                            [facility]  Patient:    Kendra Knight, Kendra Knight Visit Number: [id number] MRN: [id number]          Service Type: DSU Location: DAY Attending Physician:  Florian Buff Dictated by:   Tania Ade, M.D. Proc. Date: 10/05/01 Admit Date:  10/05/2001                             Operative Report  PREOPERATIVE DIAGNOSIS:  Missed abortion, first trimester.  POSTOPERATIVE DIAGNOSIS:  Missed abortion, first trimester.  OPERATION: 1. Cervical dilatation. 2. Suction uterine curettage. 3. Sharp uterine curettage.  SURGEON:  Tania Ade, M.D.  ANESTHESIA:  Mask.  FINDINGS:  The patient had been seen in the office earlier in the day and was found to have a nonviable pregnancy in the first trimester.  She would have been 9 weeks 5 days by dates based on a sonogram on 31 December; however, there was no fetal cardiac activity where there had been two weeks ago, and the pregnancy itself, the embryonic pole had actually shrunk.  DESCRIPTION OF PROCEDURE:  The patient was taken to the operating room and placed in the supine position where she underwent IV sedation.  She was then given propofol IV and given anesthesia by mask.  She was placed in the dorsolithotomy position.  The vagina and perineum were prepped using Betadine. The patient was draped in the usual sterile fashion.  Her bladder was drained with a  red rubber catheter.  Speculum was placed, and the cervix was grasped using single-tooth tenaculum.  Hegar dilators were used, and the cervix was dilated serially without difficulty to allow the passage of a #8 curved suction curet.  Three passes were made with suction curet gently in a twisting manner, and all the tissue was removed with gentle sharp curettage using a #4 curet.  One more pass of suction curet was used, and all of this was done gently.  There was acceptable bleeding at the end of the case.  The patient was given methergine 0.2 mg IM.  The single-tooth tenaculum was taken out. The speculum was removed.  The patient was stabilized and taken to the recovery room in good and stable condition.  She will be discharged home from the recovery room and followed up in the office in two weeks. Dictated by:   Tania Ade, M.D. Attending Physician:  Florian Buff DD:  10/05/01 TD:  10/05/01 Job: [id number]     Electronically signed by Transcription Int Incoming at 05/23/2006 8:26 AM Electronically signed by Interface, Transcription Conversion at 01/25/2011 12:32 PM         [facility]  Patient:    Kendra Knight,  Kendra Knight Visit Number: [id number] MRN: [id number]          Service Type: DSU Location: DAY Attending Physician:  Florian Buff Dictated by:   Jacelyn Grip., M.D. Proc. Date: 10/28/01 Admit Date:  10/05/2001 Discharge Date: 10/05/2001                             Operative Report  PREOPERATIVE DIAGNOSES: 1. Pelvic mass. 2. Probable hematoma.  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  PROCEDURES: 1. Operative laparoscopy with excision of peritoneal fibroma versus    spindle cell tumor. 2. Attempted posterior colpotomy with removal of mass, failed. 3. A mini-laparotomy to remove the mass from the abdomen.  SURGEON:  Jacelyn Grip., M.D.  ANESTHESIA:   General endotracheal anesthesia.  FINDINGS:  Upon entering the abdomen and seeing the pelvis with the video laparoscope, it was noted that the uterus appeared to be grossly normal as were both ovaries and tubes.  There was no blood in the cul-de-sac or any blood at all in the pelvis or abdomen.  There was, however, a large, sort of multi-lobulated bulge posterior to the uterus between the uterus and the rectum that really went from pelvic sidewall to pelvic sidewall.  Both ureters could be seen easily as well as the inferior epigastric arteries.  This certainly was not a pelvic kidney since I could see the ureters.  I knew it was some sort of connective tissue mass.  I thought it might be a leiomyoma but indeed it was not attached to the uterus at all.  I got an intraoperative and almost postoperative frozen section and actually viewed the microscopy with Dr. Gordy Savers and at this point it was felt to be a benign fibroma; however, there is some hypercellularity and there is a concern of a spindle cell tumor. We will await permanent sections for that information.  There were no other abnormalities seen of the abdomen and pelvis.  DESCRIPTION OF PROCEDURE:  The patient was taken to the operating room, placed in the supine position where she underwent general endotracheal anesthesia. She was then placed in the dorsal lithotomy position and prepped and draped in the usual sterile fashion for a laparoscopic procedure.  I could not put a Hulka tenaculum on the cervix because the cervix was displaced so anteriorly. So I placed a sponge stick after having prepped the vagina and also placed a Foley catheter.  The patient had Flotrons placed in the preoperative area and also had 1 g of Ancef.  A semicircular incision was made under the umbilicus and a [id number] trocar was placed in the peritoneal cavity under direct continuous visualization with the video laparoscope in the sheath.  Upward traction of  the abdomen facilitated this and the peritoneal cavity was entered without difficulty.  The peritoneal cavity was insufflated.  A 5 mm trocar was then placed two fingerbreadths above the pubis under direct continuous visualization and the above-noted findings seen.  A 5 mm trocar was then placed in the right lower quadrant and subsequently the left lower quadrant and both were placed under direct continuous visualization without difficulty.  Blunt probes were used for traction and again the ureters and the pelvic vasculature was viewed directly. The posterior peritoneal reflection was then taken off with the electrocautery scissors.  A small area was cauterized and then incised and a use  of scissors to do a blunt dissection of the peritoneum off the mass.  It seemed that the more peritoneum I opened, the larger the mass seemed to be.  Great care was used.  The suction irrigation was used to also help dissect.  There was very little blood loss during this portion of the procedure.  Again, I was very particular to stay away from uterine vasculature, hypogastric arteries and the ureters on both sides.  Most of the attention was done to the left.  I used traction and blunt dissection to shell out the soft tissue mass.  I actually got Dr. Arnoldo Morale to come in and view the early parts of the procedure to make sure that there was no involvement of the gastrointestinal system and he felt there was not and he helped as I dissected off the the mass off the posterior lower portion of the cervix and probably the vagina.  He actually did digital exam and we determined where the vagina began.  I continued to use traction and blunt dissection to remove the mass.  When I got the fibrous bands or bands that I felt might be vascular, I used electrocautery scissors for cutting.  After about an hour-and-a-half of this, the entire mass was removed and there was good hemostasis of all the pedicles and it felt  indeed felt to be some sort of connective soft tissue mass.  We thought it might be a lipoma or a leiomyoma.  I then turned my attention to trying to remove the mass from the abdominal cavity and I knew it was too big to come through the umbilical incision and I did not want to increase the umbilical incision greatly for cosmetic reasons. As a result I thought I might try it through the posterior colpotomy incision. It was placed into the posterior cul-de-sac.  Attention was turned vaginally.  A reprep of the vagina was performed.  The cervix was grasped in the posterior lip of the cervix.  Enough retraction was placed.  The vagina was incised at the posterior reflection and I tried to enter the peritoneum but was unable to because of the distorted anatomy from where I peeled off this fibroma.  I found the peritoneum but was not able to enter because of poor visualization and again altered anatomy.  Close attention was paid to stay away from any injury to the rectum.  I then sewed the vaginal incision up with interrupted 0 Vicryl sutures and there was good hemostasis.  I then decided to perform a mini-laparotomy incision two fingerbreadths below the pubis.  Incision was made and carried down sharply to the rectus fascia  which was scored in the midline and extended laterally.  The fascia was taken off the muscle superiorly and inferiorly without difficulty.  The muscles were divided.  The peritoneal cavity was entered.  I kept the mass in the endopouch and easily retrieved it through the incision.  I then used irrigation and suction to make sure the site was hemostatic.  Cautery was used in two small areas for peritoneal edge oozing.  There were no other abnormalities seen. Pictures were taken of the mass and they were sent to pathology for frozen section stated as above.  All pedicles were found to be hemostatic.  The muscles were reapproximated loosely.  The umbilical incision  fascia was then closed and examined from inside the peritoneum with good repair of the defect.  The fascia was closed using 0 Vicryl running.  Subcutaneous tissue was irrigated and made hemostatic.  In all of the incisions skin was closed using skin staples.  The patient tolerated the procedure well.  She experienced 150-200 cc of blood loss during the procedure.  She received an additional gram of Ancef prophylactically intraoperatively as well and was taken to the recovery room in good and stable condition.  All counts were correct x 3. Dictated by:   Jacelyn Grip., M.D. Attending Physician:  Florian Buff DD:  10/28/01 TD:  10/29/01 Job: [id number]     Electronically signed by Transcription Int Incoming at 05/23/2006 8:53 AM Electronically signed by Interface, Transcription Conversion at 01/25/2011 12:32 PM    NAME:  Kendra Knight                           ACCOUNT NO.:  [id number]   MEDICAL RECORD NO.:  [id number]                   PATIENT TYPE:  AMB   LOCATION:  DAY                                  FACILITY:  [facility]   PHYSICIAN:  Florian Buff, M.D.                DATE OF BIRTH:  1971-05-19   DATE OF PROCEDURE:  12/23/2002  DATE OF DISCHARGE:                                 OPERATIVE REPORT   PREOPERATIVE DIAGNOSES:  1. Intrauterine pregnancy at 38-2/7 weeks.  2. Back down oblique lie.  3. Large anterior myoma.   POSTOPERATIVE DIAGNOSES:  1. Intrauterine pregnancy at 38-2/7 weeks.  2. Back down oblique lie.  3. No anterior myoma.  4. Large posterior peritoneal smooth-muscle tumor, recurrent, that basically     flipped anterior.   PROCEDURE:  Primary low transverse cesarean section with bilateral tubal  ligation.   SURGEON:  Florian Buff, M.D.   ANESTHESIA:  Spinal.   FINDINGS:  We delivered a viable female at 60 with Apgars of 9 and 9,  weighing 8 pounds 13.6 ounces.  There is three-vessel cord.  Placenta was  normal.  The uterus was  somewhat rotated anteriorly because of the large  posterior mass.  The tubes and ovaries are normal, but we took pictures of  the mass.  It is a large, probably 15 x 24 cm posterior uterine mass,  arising from the same area that her smooth-muscled tumor was last year.  I  had an intraoperative evaluation by Marzetta Merino, M.D.  He reviewed the  slides, looked at the mass, and we actually called John T. Clarene Essex, M.D. at  [facility].  I talked to Marti Sleigh, M.D. last year regarding this  tumor and felt there was a good chance of local recurrence, but there was no  indication for anything more definitive at that time.  We agreed  intraoperatively that we would allow this thing to shrink down from the  pregnancy hormones and blood supply standpoint and come back and readdress  it in about 6-8 weeks.  The peritoneal cavity and the bowel and everything  was otherwise normal.  There were no abnormalities that were suggestive of  frank  malignancy.   DESCRIPTION OF OPERATION:  The patient was taken to the operating room and  underwent spinal anesthetic, placed in the dorsal lithotomy position.  She  had a bump under her right hip.  She was prepped and draped in the usual  sterile fashion.  A Foley catheter was placed.  A Pfannenstiel skin incision  was made and carried down sharply to the rectus fascia which was scored in  the midline and extended laterally.  The fascia was taken off the muscles  superiorly and inferiorly without difficulty.  The muscles were divided.  The peritoneal cavity was entered.  A low transverse hysterotomy incision  was made.  The bladder was taken down without difficulty.  The infant was  converted to a footling breech presentation and delivered in a breech-  assisted manner without difficulty.  Three-vessel cord; it was clamped, cut.  Cord blood, cord gas was sent.  The infant was handed to [facility],  R.N., who was in attendance for routine neonatal  resuscitation.  The  placenta was delivered spontaneously.  The uterus was delivered, and we  noted the mass.  We took pictures and measured it.  It was probably about 15-  16 cm x 24 cm.  In any event, the above-noted consult was done.  The uterus  was closed in two layers, first being a running interlocking layer, the  second being an embrocating layer.  Modified Pomeroy bilateral tubal  ligation was performed in the usual fashion.  The uterus was placed in the  peritoneal cavity.  The muscles reapproximated loosely.  The fascia was  closed using 0 Vicryl running.  Subcutaneous tissue was irrigated.  The skin  was closed using skin staples.  The patient tolerated the procedure well.  She experienced 800 mL of blood loss and was taken to the recovery room in  good stable condition.  All counts were correct x 3.                                               Florian Buff, M.D.    Estevan Oaks  D:  12/23/2002  T:  12/23/2002  Job:  [id number]     Electronically signed by Transcription Int Incoming at 05/23/2006 5:56 AM Electronically signed by Interface, Transcription Conversion at 01/25/2011 12:32 PM     NAME:  Kendra Knight                           ACCOUNT NO.:  [id number]   MEDICAL RECORD NO.:  [id number]                   PATIENT TYPE:  AMB   LOCATION:  DAY                                  FACILITY:  [facility]   PHYSICIAN:  Florian Buff, M.D.                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   POSTOPERATIVE DIAGNOSES:  1. 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  2. Repair of rectal bowel injury.   PROCEDURE:  1. Abdominal hysterectomy.  2. Excision of large pelvic mass posterior to the uterus, sidewall to     sidewall, and anterior abdominal wall way back into the sacral     promontory.  3.  Repair of rectal bowel injury by Dr. Irving Shows.   SURGEON:  1. Florian Buff, M.D.  2. Vernon Prey. Tamala Julian, M.D. who came in and did the evaluation and repair of the     rectal bowel injury.   ANESTHESIA:  General endotracheal.   FINDINGS:  The patient was known to have a recurrent smooth muscle tumor of  the posterior cul-de-sac.  It originally was excised in February, 2003.  She  wanted another child and shortly thereafter got pregnant.  She had to have a  C-section because of an obstructed pelvis because of the recurrence of the  mass.  At the time of surgery, I knew that it was adherent to the sigmoid  colon.  I really could not see all the way down the backside, but I was  suspicious that it was densely adherent to the rectum.  That was confirmed  today.  The mass went up to the patient's umbilicus.  It completely filled  the pelvis and uterosacral promontory.  Her uterus was quite small, and it  went from sidewall to sidewall.  It was basically rotated 90 degrees towards  the anterior, and the sigmoid colon was like in the mid-pelvis from an  anterior to posterior standpoint, and then the rectum was densely adherent  along the back wall of the mass.  In attempting to dissect the rectum off of  the mass, a small injury occured incidental to the dissection of the rectum  off of the mass.  The patient had been preoperatively bowel prepped.  I  called Dr. Tamala Julian in, and he evaluated the situation and felt that a primary  closure was appropriate.  The ovaries, otherwise, were normal.  There were  no other abnormalities of the pelvis or abdomen noted.   DESCRIPTION OF PROCEDURE:  The patient was taken to the operating room and  placed in the supine position where she underwent general endotracheal  anesthesia.  Her vagina was prepped in the usual sterile fashion, and a  Foley catheter was placed.  The abdomen was then prepped and draped in the  usual sterile fashion.   A  Pfannenstiel skin incision was made in the same incision that I used for  her C-section back in April.  It was carried down to the rectus fascia,  scored in the midline, and extended laterally.  The muscles were divided.  The peritoneal cavity was entered without difficulty.  The course of the  mass filled the entire pelvis up to the sacral promontory, up to the  umbilicus, and sidewall to sidewall.  The anatomy was quite distorted, and  so initially I went ahead and got the utero-ovarian ligaments bilaterally.  They were clamped, cut, and double suture ligated.  I then got the uterine  vessels after skeletonization.  I then had to manually dissect the mass off  the pelvic sidewalls.  The sigmoid was loosely adherent, but then the rectum  was densely adherent to the mass and everything was rotated 90 degrees.  The  rectum was  basically inseparable from the posterior vagina and the mass.  I  fairly easily dissected the sigmoid off of the uterus.  Below this point,  the rectum became just densely adherent and intimate with the posterior wall  of the mass and the vagina.  Basically, all three were indistinguishable.  I  picked a plane that I thought was on the mass and in fact turned out to make  a small, approximately 3-cm injury into the rectum.  It was clean with no  spillage.  The patient had been preoperatively mechanically bowel prepped,  with good results.  She had nothing but clear water coming out at the end,  and there was no spillage at the time of surgery.  At this time, I called  Dr. Irving Shows in, and he evaluated the situation and felt that a primary  closure would be appropriate given the preoperative bowel prep and the area  of the injury.  We continued to dissect the rectum off at this point of the  mass and posterior vagina and then crossclamped the mass across the vagina.  The vagina was closed with interrupted figure-of-eight sutures.  Vaginal  angle sutures were placed  bilaterally, and there was good hemostasis of  this.  Careful attention was paid not to injure the bladder because  everything was so attenuated by the mass and everything had been pulled up.  The vagina was very long and very attenuated, and the bladder was basically  up to the point of this part of the dissection, and great care was taken not  to injure the bladder.  We stayed medial to the uterine vessels, so ureteral  injury was not a concern.  The mass itself really had no specific blood  supply, but it sort of had vascularity from the peritoneal surfaces.  These  were treated with cautery and with individual sutures.  Dr. Tamala Julian then did  the primary closure of the rectal injury.  I will leave those details to his  operative report.  Basically he did a three-layer closure, the first two  layers using 3-0 Monocryl and the last being a 3-0 Prolene, with good  reapproximation and good serosal coverage of the area.  The pelvis was  irrigated vigorously.  Hemostasis was achieved again with individual sutures  and electrocautery unit.  Two JP drains were placed in each side of the rectum down into the cul-de-sac.  The mass went all the way back down  basically to the bottom of the pelvis, but the peritoneum was hemostatic  here.  Intercede was placed over the vaginal cuff to keep the ovaries from  becoming adherent to them.  The two JP drains were taken out the right and  left lower quadrant.  The muscles were reapproximated loosely.  The fascia  was closed using 0 Vicryl running.  The subcutaneous tissue was made  hemostatic and irrigated.  The skin was closed using skin staples.   The patient tolerated the procedure well.  She experienced 500 cc of blood  loss.  She was taken to the recovery room in good and stable condition.  All  counts were correct x3.  She had two JP drains and an NG tube placed during  the procedure as well.  She received Ancef prophylactically, Levaquin 500 mg  and  Flagyl 500 mg at the time of the bowel injury.  All specimens were sent  to the lab for evaluation.  Florian Buff, M.D.    Estevan Oaks  D:  04/19/2003  T:  04/19/2003  Job:  [id number]     Electronically signed by Transcription Int Incoming at 05/23/2006 6:27 AM Electronically signed by Interface, Transcription Conversion at 01/25/2011 12:32 PM      NAME:  Kendra Knight                 ACCOUNT NO.:  [id number]   MEDICAL RECORD NO.:  [id number]          PATIENT TYPE:  INP   LOCATION:  A427                          FACILITY:  [facility]   PHYSICIAN:  Florian Buff, M.D.   DATE OF BIRTH:  February 18, 1971   DATE OF PROCEDURE:  02/23/2007  DATE OF DISCHARGE:  02/25/2007                               OPERATIVE REPORT   PREOPERATIVE DIAGNOSES:  1. Recurrent pelvic mass, spindle cell tumor.  2. Previous severe adhesions with bowel injury to sigmoid.   POSTOPERATIVE DIAGNOSES:  1. Recurrent pelvic mass, spindle cell tumor.  2. Previous severe adhesions with bowel injury to sigmoid.  3. 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   SURGEON:  Magnus Crescenzo.   ASSISTANT:  Jenkins.   FINDINGS:  Patient had had 3 surgeries in the past for recurrent spindle  cell tumors.  It was assumed this was a recurrence.  She had had her  last one 4 years ago.  The tumors had been found to be benign.  I talked  to Dr. Fermin Schwab a couple of months prior to surgery.  We decided  to try Lupron and see if it would shrink it, it did not.  At time of  surgery today she had, it was really a cystic mass and it was full of  mucin.  Frozen section confirmed that but also confirmed spindle cell  tumor at the base of it; so, I am unsure about the final pathology.  I  reviewed the frozen with the  pathologist.  Otherwise, there was no bowel  involvement today, it was retroperitoneal.   DESCRIPTION OF OPERATION:  The patient was taken to the operating room,  placed in the supine position, underwent general endotracheal  anesthesia, prepped and draped in the usual sterile fashion.  A  Pfannenstiel skin incision was made, carried down sharply to the rectus  fascia.  It was scored in the midline, extended laterally.  The fascia  was taken off of the muscle superiorly and inferiorly without  difficulty.  The muscles were divided, peritoneal cavity was entered.  Dr. Arnoldo Morale and I just did manual retraction.  The mass was identified.  A lot of sharp and blunt dissection was performed to isolate the mass.  We identified both ureters, it took a great deal of time in tracking  them all the way down into the bladder, identified bowel and adhesions  of it to the bowel.  It went all the way down to the top of the vaginal  cuff, which is where it originated from before, and in dissecting we  actually ruptured and it was  found to be a mucinous cystadenoma.  It  appeared to be mucin anyway and I sent that off for cytology.  There  really was no blood supply to this mass that we could find.  We  basically just took it off the vaginal cuff and then I closed the cuff  back.  Again, we made sure the ureters were uninjured and no bowel  injury had occurred during the procedure either.  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  We reapproximated the  muscles and peritoneum, closed the fascia.  We made sure the subcu was  hemostatic and closed the skin with skin staples.  The patient tolerated  the procedure well.  She experienced 200 mL of blood loss.  Taken to the  recovery room in good stable condition.  All counts correct.      Florian Buff, M.D.  Electronically Signed     LHE/MEDQ  D:  04/03/2007  T:  04/03/2007  Job:  [id number]    Electronically  signed by Transcription Int Incoming at 04/03/2007 1:39 PM Electronically signed by Interface, Transcription Conversion at 01/22/2011 2:52 PM

## 2016-08-19 ENCOUNTER — Telehealth: Payer: Self-pay | Admitting: *Deleted

## 2016-08-22 NOTE — Telephone Encounter (Signed)
I returned call  And answred all questions

## 2016-08-26 ENCOUNTER — Telehealth: Payer: Self-pay | Admitting: Nurse Practitioner

## 2016-08-26 NOTE — Telephone Encounter (Signed)
Patient left voicemail requesting to reschedule surgery from 09/19/16 to first available in Feb with Dr. Denman George. Per Lenna Sciara, NP, this Rn called patient back to inform her of new surgery date 10/10/16. She states the surgery was not covered on previous insurance due to provider out of network. She will be switching carriers to [facility] in Jan and has verified that Dr. Denman George in an in-network provider. Pt will call back when she gets new insurance card to give new info. She is encouraged to call back sooner with new questions or concerns.

## 2016-09-14 ENCOUNTER — Other Ambulatory Visit: Payer: Self-pay | Admitting: Obstetrics & Gynecology

## 2016-09-17 ENCOUNTER — Telehealth: Payer: Self-pay | Admitting: *Deleted

## 2016-09-17 MED ORDER — CLONAZEPAM 0.5 MG PO TABS: 0.5000 mg | ORAL_TABLET | Freq: Two times a day (BID) | ORAL | 0 refills | Status: DC | PRN

## 2016-09-17 NOTE — Telephone Encounter (Signed)
Pt called to request meds for anxiety has up coming surgery 2/1.Has taken klonopin in past

## 2016-09-17 NOTE — Telephone Encounter (Signed)
Spoke with Kendra Knight. Kendra Knight is having extreme anxiety. She is requesting meds. Kendra Knight was in tears on the phone. Kendra Knight usually sees Dr. Elonda Husky but when she found out he wasn't here today she wanted JAG to review message. Please advise. Thanks! [place]

## 2016-09-18 ENCOUNTER — Telehealth: Payer: Self-pay | Admitting: *Deleted

## 2016-09-18 NOTE — Telephone Encounter (Signed)
Called patient regarding fax from [place]. Patient states she has different insurance. I spoke to [facility] who stated she will try to help patient get insurance changed in computer. Call was dropped. I returned patients call again but she did not answer. I left message for her to return our call.

## 2016-09-21 ENCOUNTER — Other Ambulatory Visit: Payer: Self-pay | Admitting: Obstetrics & Gynecology

## 2016-09-21 MED ORDER — SERTRALINE HCL 100 MG PO TABS: 100.0000 mg | ORAL_TABLET | Freq: Every day | ORAL | 2 refills | Status: DC

## 2016-10-07 NOTE — Patient Instructions (Addendum)
ZINIA FITTIPALDI  10/07/2016   Your procedure is scheduled on: 10/10/2016  Report to [facility] [place] Main  Entrance take [facility]  elevators to 3rd floor to  [place] at    [phone].  Call this number if you have problems the morning of surgery [phone]   Remember: ONLY 1 PERSON MAY GO WITH YOU TO SHORT STAY TO GET  READY MORNING OF [place].  Do not eat food or drink liquids :After Midnight.               Drink 2 bottles of Magnesium Citrate starting at 0900am or 1000am the day before surgery. Eat a light diet the day before surgery. Examples include soups, broths, toast, yogurt and mashed potatoes.  Things to avoid include: raw fruits, vegetables and beans.       Take these medicines the morning of surgery with A SIP OF WATER:  Synthroid, zoloft, Klononopin if needed, Zyrtec if needed                   You may not have any metal on your body including hair pins and              piercings  Do not wear jewelry, make-up, lotions, powders or perfumes, deodorant             Do not wear nail polish.  Do not shave  48 hours prior to surgery.               Do not bring valuables to the hospital. [place].  Contacts, dentures or bridgework may not be worn into surgery.  Leave suitcase in the car. After surgery it may be brought to your room.                Please read over the following fact sheets you were given: _____________________________________________________________________             [facility] - Preparing for Surgery Before surgery, you can play an important role.  Because skin is not sterile, your skin needs to be as free of germs as possible.  You can reduce the number of germs on your skin by washing with CHG (chlorahexidine gluconate) soap before surgery.  CHG is an antiseptic cleaner which kills germs and bonds with the skin to continue killing germs even after washing. Please DO NOT use if you  have an allergy to CHG or antibacterial soaps.  If your skin becomes reddened/irritated stop using the CHG and inform your nurse when you arrive at Short Stay. Do not shave (including legs and underarms) for at least 48 hours prior to the first CHG shower.  You may shave your face/neck. Please follow these instructions carefully:  1.  Shower with CHG Soap the night before surgery and the  morning of Surgery.  2.  If you choose to wash your hair, wash your hair first as usual with your  normal  shampoo.  3.  After you shampoo, rinse your hair and body thoroughly to remove the  shampoo.                           4.  Use CHG as you would any other liquid soap.  You  can apply chg directly  to the skin and wash                       Gently with a scrungie or clean washcloth.  5.  Apply the CHG Soap to your body ONLY FROM THE NECK DOWN.   Do not use on face/ open                           Wound or open sores. Avoid contact with eyes, ears mouth and genitals (private parts).                       Wash face,  Genitals (private parts) with your normal soap.             6.  Wash thoroughly, paying special attention to the area where your surgery  will be performed.  7.  Thoroughly rinse your body with warm water from the neck down.  8.  DO NOT shower/wash with your normal soap after using and rinsing off  the CHG Soap.                9.  Pat yourself dry with a clean towel.            10.  Wear clean pajamas.            11.  Place clean sheets on your bed the night of your first shower and do not  sleep with pets. Day of Surgery : Do not apply any lotions/deodorants the morning of surgery.  Please wear clean clothes to the hospital/surgery center.  FAILURE TO FOLLOW THESE INSTRUCTIONS MAY RESULT IN THE CANCELLATION OF YOUR SURGERY PATIENT SIGNATURE_________________________________  NURSE  SIGNATURE__________________________________  ________________________________________________________________________  WHAT IS A BLOOD TRANSFUSION? Blood Transfusion Information  A transfusion is the replacement of blood or some of its parts. Blood is made up of multiple cells which provide different functions.  Red blood cells carry oxygen and are used for blood loss replacement.  White blood cells fight against infection.  Platelets control bleeding.  Plasma helps clot blood.  Other blood products are available for specialized needs, such as hemophilia or other clotting disorders. BEFORE THE TRANSFUSION  Who gives blood for transfusions?   Healthy volunteers who are fully evaluated to make sure their blood is safe. This is blood bank blood. Transfusion therapy is the safest it has ever been in the practice of medicine. Before blood is taken from a donor, a complete history is taken to make sure that person has no history of diseases nor engages in risky social behavior (examples are intravenous drug use or sexual activity with multiple partners). The donor's travel history is screened to minimize risk of transmitting infections, such as malaria. The donated blood is tested for signs of infectious diseases, such as HIV and hepatitis. The blood is then tested to be sure it is compatible with you in order to minimize the chance of a transfusion reaction. If you or a relative donates blood, this is often done in anticipation of surgery and is not appropriate for emergency situations. It takes many days to process the donated blood. RISKS AND COMPLICATIONS Although transfusion therapy is very safe and saves many lives, the main dangers of transfusion include:   Getting an infectious disease.  Developing a transfusion reaction. This is an allergic reaction to something in the blood you were given. Every  precaution is taken to prevent this. The decision to have a blood transfusion has been  considered carefully by your caregiver before blood is given. Blood is not given unless the benefits outweigh the risks. AFTER THE TRANSFUSION  Right after receiving a blood transfusion, you will usually feel much better and more energetic. This is especially true if your red blood cells have gotten low (anemic). The transfusion raises the level of the red blood cells which carry oxygen, and this usually causes an energy increase.  The nurse administering the transfusion will monitor you carefully for complications. HOME CARE INSTRUCTIONS  No special instructions are needed after a transfusion. You may find your energy is better. Speak with your caregiver about any limitations on activity for underlying diseases you may have. SEEK MEDICAL CARE IF:   Your condition is not improving after your transfusion.  You develop redness or irritation at the intravenous (IV) site. SEEK IMMEDIATE MEDICAL CARE IF:  Any of the following symptoms occur over the next 12 hours:  Shaking chills.  You have a temperature by mouth above 102 F (38.9 C), not controlled by medicine.  Chest, back, or muscle pain.  People around you feel you are not acting correctly or are confused.  Shortness of breath or difficulty breathing.  Dizziness and fainting.  You get a rash or develop hives.  You have a decrease in urine output.  Your urine turns a dark color or changes to pink, red, or brown. Any of the following symptoms occur over the next 10 days:  You have a temperature by mouth above 102 F (38.9 C), not controlled by medicine.  Shortness of breath.  Weakness after normal activity.  The white part of the eye turns yellow (jaundice).  You have a decrease in the amount of urine or are urinating less often.  Your urine turns a dark color or changes to pink, red, or brown. Document Released: 08/23/2000 Document Revised: 11/18/2011 Document Reviewed: 04/11/2008 ExitCare Patient Information 2014  [place].  _______________________________________________________________________  Incentive Spirometer  An incentive spirometer is a tool that can help keep your lungs clear and active. This tool measures how well you are filling your lungs with each breath. Taking long deep breaths may help reverse or decrease the chance of developing breathing (pulmonary) problems (especially infection) following:  A long period of time when you are unable to move or be active. BEFORE THE PROCEDURE   If the spirometer includes an indicator to show your best effort, your nurse or respiratory therapist will set it to a desired goal.  If possible, sit up straight or lean slightly forward. Try not to slouch.  Hold the incentive spirometer in an upright position. INSTRUCTIONS FOR USE  1. Sit on the edge of your bed if possible, or sit up as far as you can in bed or on a chair. 2. Hold the incentive spirometer in an upright position. 3. Breathe out normally. 4. Place the mouthpiece in your mouth and seal your lips tightly around it. 5. Breathe in slowly and as deeply as possible, raising the piston or the ball toward the top of the column. 6. Hold your breath for 3-5 seconds or for as long as possible. Allow the piston or ball to fall to the bottom of the column. 7. Remove the mouthpiece from your mouth and breathe out normally. 8. Rest for a few seconds and repeat Steps 1 through 7 at least 10 times every 1-2 hours when you are awake.  Take your time and take a few normal breaths between deep breaths. 9. The spirometer may include an indicator to show your best effort. Use the indicator as a goal to work toward during each repetition. 10. After each set of 10 deep breaths, practice coughing to be sure your lungs are clear. If you have an incision (the cut made at the time of surgery), support your incision when coughing by placing a pillow or rolled up towels firmly against it. Once you are able to get  out of bed, walk around indoors and cough well. You may stop using the incentive spirometer when instructed by your caregiver.  RISKS AND COMPLICATIONS  Take your time so you do not get dizzy or light-headed.  If you are in pain, you may need to take or ask for pain medication before doing incentive spirometry. It is harder to take a deep breath if you are having pain. AFTER USE  Rest and breathe slowly and easily.  It can be helpful to keep track of a log of your progress. Your caregiver can provide you with a simple table to help with this. If you are using the spirometer at home, follow these instructions: [place] IF:   You are having difficultly using the spirometer.  You have trouble using the spirometer as often as instructed.  Your pain medication is not giving enough relief while using the spirometer.  You develop fever of 100.5 F (38.1 C) or higher. SEEK IMMEDIATE MEDICAL CARE IF:   You cough up bloody sputum that had not been present before.  You develop fever of 102 F (38.9 C) or greater.  You develop worsening pain at or near the incision site. MAKE SURE YOU:   Understand these instructions.  Will watch your condition.  Will get help right away if you are not doing well or get worse. Document Released: 01/06/2007 Document Revised: 11/18/2011 Document Reviewed: 03/09/2007 ExitCare Patient Information 2014 ExitCare, [place].   ________________________________________________________________________    CLEAR LIQUID DIET   Foods Allowed                                                                     Foods Excluded  Coffee and tea, regular and decaf                             liquids that you cannot  Plain Jell-O in any flavor                                             see through such as: Fruit ices (not with fruit pulp)                                     milk, soups, orange juice  Iced Popsicles                                    All  solid  food Carbonated beverages, regular and diet                                    Cranberry, grape and apple juices Sports drinks like Gatorade Lightly seasoned clear broth or consume(fat free) Sugar, honey syrup  Sample Menu Breakfast                                Lunch                                     Supper Cranberry juice                    Beef broth                            Chicken broth Jell-O                                     Grape juice                           Apple juice Coffee or tea                        Jell-O                                      Popsicle                                                Coffee or tea                        Coffee or tea  _____________________________________________________________________

## 2016-10-08 ENCOUNTER — Other Ambulatory Visit (HOSPITAL_COMMUNITY): Payer: PRIVATE HEALTH INSURANCE

## 2016-10-09 ENCOUNTER — Encounter (INDEPENDENT_AMBULATORY_CARE_PROVIDER_SITE_OTHER): Payer: Self-pay

## 2016-10-09 ENCOUNTER — Telehealth: Payer: Self-pay | Admitting: Gynecologic Oncology

## 2016-10-09 ENCOUNTER — Encounter (HOSPITAL_COMMUNITY): Payer: Self-pay

## 2016-10-09 ENCOUNTER — Encounter (HOSPITAL_COMMUNITY)
Admission: RE | Admit: 2016-10-09 | Discharge: 2016-10-09 | Disposition: A | Discharge status: Home / Self Care | Payer: BLUE CROSS/BLUE SHIELD | Source: Ambulatory Visit | Attending: Gynecologic Oncology | Admitting: Gynecologic Oncology

## 2016-10-09 HISTORY — DX: Adverse effect of unspecified anesthetic, initial encounter: T41.45XA

## 2016-10-09 HISTORY — DX: Hypothyroidism, unspecified: E03.9

## 2016-10-09 HISTORY — DX: Other complications of anesthesia, initial encounter: T88.59XA

## 2016-10-09 LAB — CBC WITH DIFFERENTIAL/PLATELET
Basophils Absolute: 0 10*3/uL (ref 0.0–0.1)
Basophils Relative: 0 %
EOS ABS: 0.1 10*3/uL (ref 0.0–0.7)
Eosinophils Relative: 2 %
HCT: 38.2 % (ref 36.0–46.0)
Hemoglobin: 12.8 g/dL (ref 12.0–15.0)
LYMPHS ABS: 2.1 10*3/uL (ref 0.7–4.0)
Lymphocytes Relative: 41 %
MCH: 30.3 pg (ref 26.0–34.0)
MCHC: 33.5 g/dL (ref 30.0–36.0)
MCV: 90.5 fL (ref 78.0–100.0)
Monocytes Absolute: 0.3 10*3/uL (ref 0.1–1.0)
Monocytes Relative: 6 %
Neutro Abs: 2.5 10*3/uL (ref 1.7–7.7)
Neutrophils Relative %: 51 %
Platelets: 301 10*3/uL (ref 150–400)
RBC: 4.22 MIL/uL (ref 3.87–5.11)
RDW: 13 % (ref 11.5–15.5)
WBC: 5 10*3/uL (ref 4.0–10.5)

## 2016-10-09 LAB — COMPREHENSIVE METABOLIC PANEL
ALT: 13 U/L — ABNORMAL LOW (ref 14–54)
ANION GAP: 7 (ref 5–15)
AST: 18 U/L (ref 15–41)
Albumin: 4.5 g/dL (ref 3.5–5.0)
Alkaline Phosphatase: 32 U/L — ABNORMAL LOW (ref 38–126)
BILIRUBIN TOTAL: 0.9 mg/dL (ref 0.3–1.2)
BUN: 15 mg/dL (ref 6–20)
CHLORIDE: 106 mmol/L (ref 101–111)
CO2: 25 mmol/L (ref 22–32)
Calcium: 8.9 mg/dL (ref 8.9–10.3)
Creatinine, Ser: 0.82 mg/dL (ref 0.44–1.00)
Glucose, Bld: 84 mg/dL (ref 65–99)
POTASSIUM: 3.7 mmol/L (ref 3.5–5.1)
Sodium: 138 mmol/L (ref 135–145)
TOTAL PROTEIN: 7.7 g/dL (ref 6.5–8.1)

## 2016-10-09 LAB — URINALYSIS, ROUTINE W REFLEX MICROSCOPIC
BILIRUBIN URINE: NEGATIVE
GLUCOSE, UA: NEGATIVE mg/dL
HGB URINE DIPSTICK: NEGATIVE
Ketones, ur: NEGATIVE mg/dL
Leukocytes, UA: NEGATIVE
Nitrite: NEGATIVE
PROTEIN: NEGATIVE mg/dL
Specific Gravity, Urine: 1.01 (ref 1.005–1.030)
pH: 5 (ref 5.0–8.0)

## 2016-10-09 LAB — ABO/RH: ABO/RH(D): A POS

## 2016-10-09 NOTE — Progress Notes (Signed)
Patient felt uncomfortable about signing consent because she wanted Dr Denman George to only take one ovary not both.  Attempted to call office x 2 while patient was here for preop.  No answer.  Spoke with [employer] and she will call patient and talk with her today.

## 2016-10-09 NOTE — Telephone Encounter (Signed)
Called patient to discuss upcoming surgery.  Advised that Dr. Denman George is aware of her desire to keep at least one ovary and that we have to list opphorectomy as a possible on the consent in case a cancer was identified or if the mass had taken over the structure of an ovary.  Verbalizing understanding.

## 2016-10-10 ENCOUNTER — Encounter (HOSPITAL_COMMUNITY)
Admission: RE | Disposition: A | Discharge status: Home / Self Care | Payer: Self-pay | Source: Ambulatory Visit | Attending: Obstetrics & Gynecology

## 2016-10-10 ENCOUNTER — Encounter (HOSPITAL_COMMUNITY): Payer: Self-pay | Admitting: *Deleted

## 2016-10-10 ENCOUNTER — Ambulatory Visit (HOSPITAL_COMMUNITY): Payer: BLUE CROSS/BLUE SHIELD | Admitting: Certified Registered Nurse Anesthetist

## 2016-10-10 ENCOUNTER — Inpatient Hospital Stay (HOSPITAL_COMMUNITY)
Admission: RE | Admit: 2016-10-10 | Discharge: 2016-10-12 | DRG: 742 | Disposition: A | Discharge status: Home / Self Care | Payer: BLUE CROSS/BLUE SHIELD | Source: Ambulatory Visit | Attending: Obstetrics & Gynecology | Admitting: Obstetrics & Gynecology

## 2016-10-10 DIAGNOSIS — E039 Hypothyroidism, unspecified: Secondary | ICD-10-CM | POA: Diagnosis present

## 2016-10-10 DIAGNOSIS — K668 Other specified disorders of peritoneum: Secondary | ICD-10-CM | POA: Diagnosis present

## 2016-10-10 DIAGNOSIS — R1900 Intra-abdominal and pelvic swelling, mass and lump, unspecified site: Secondary | ICD-10-CM | POA: Diagnosis present

## 2016-10-10 DIAGNOSIS — N83202 Unspecified ovarian cyst, left side: Secondary | ICD-10-CM | POA: Diagnosis present

## 2016-10-10 DIAGNOSIS — Z888 Allergy status to other drugs, medicaments and biological substances status: Secondary | ICD-10-CM

## 2016-10-10 DIAGNOSIS — D62 Acute posthemorrhagic anemia: Secondary | ICD-10-CM | POA: Diagnosis not present

## 2016-10-10 DIAGNOSIS — Z881 Allergy status to other antibiotic agents status: Secondary | ICD-10-CM | POA: Diagnosis not present

## 2016-10-10 DIAGNOSIS — K660 Peritoneal adhesions (postprocedural) (postinfection): Secondary | ICD-10-CM | POA: Diagnosis present

## 2016-10-10 DIAGNOSIS — B349 Viral infection, unspecified: Secondary | ICD-10-CM | POA: Diagnosis present

## 2016-10-10 DIAGNOSIS — Z90710 Acquired absence of both cervix and uterus: Secondary | ICD-10-CM

## 2016-10-10 DIAGNOSIS — Z79899 Other long term (current) drug therapy: Secondary | ICD-10-CM

## 2016-10-10 DIAGNOSIS — Z885 Allergy status to narcotic agent status: Secondary | ICD-10-CM

## 2016-10-10 DIAGNOSIS — N83292 Other ovarian cyst, left side: Secondary | ICD-10-CM

## 2016-10-10 DIAGNOSIS — Z794 Long term (current) use of insulin: Secondary | ICD-10-CM | POA: Diagnosis not present

## 2016-10-10 DIAGNOSIS — I959 Hypotension, unspecified: Secondary | ICD-10-CM

## 2016-10-10 HISTORY — PX: LAPAROTOMY: SHX154

## 2016-10-10 LAB — TYPE AND SCREEN
ABO/RH(D): A POS
ANTIBODY SCREEN: NEGATIVE

## 2016-10-10 LAB — HEMOGLOBIN AND HEMATOCRIT, BLOOD
HCT: 32.3 % — ABNORMAL LOW (ref 36.0–46.0)
Hemoglobin: 11.1 g/dL — ABNORMAL LOW (ref 12.0–15.0)

## 2016-10-10 SURGERY — LAPAROTOMY, EXPLORATORY
Anesthesia: General

## 2016-10-10 MED ORDER — 0.9 % SODIUM CHLORIDE (POUR BTL) OPTIME
TOPICAL | Status: DC | PRN
  Administered 2016-10-10: 2000 mL

## 2016-10-10 MED ORDER — ARTIFICIAL TEARS OP OINT
TOPICAL_OINTMENT | OPHTHALMIC | Status: AC
  Filled 2016-10-10: qty 3.5

## 2016-10-10 MED ORDER — KETOROLAC TROMETHAMINE 30 MG/ML IJ SOLN
INTRAMUSCULAR | Status: AC
  Filled 2016-10-10: qty 1

## 2016-10-10 MED ORDER — SUGAMMADEX SODIUM 200 MG/2ML IV SOLN
INTRAVENOUS | Status: DC | PRN
  Administered 2016-10-10: 200 mg via INTRAVENOUS

## 2016-10-10 MED ORDER — SODIUM CHLORIDE 0.9 % IJ SOLN
INTRAMUSCULAR | Status: DC | PRN
  Administered 2016-10-10: 20 mL

## 2016-10-10 MED ORDER — HYDROMORPHONE HCL 1 MG/ML IJ SOLN
0.2500 mg | INTRAMUSCULAR | Status: DC | PRN
  Administered 2016-10-10: 0.5 mg via INTRAVENOUS

## 2016-10-10 MED ORDER — IBUPROFEN 800 MG PO TABS
800.0000 mg | ORAL_TABLET | Freq: Four times a day (QID) | ORAL | Status: DC
  Administered 2016-10-11 – 2016-10-12 (×4): 800 mg via ORAL
  Filled 2016-10-10 (×4): qty 1

## 2016-10-10 MED ORDER — KETOROLAC TROMETHAMINE 30 MG/ML IJ SOLN
INTRAMUSCULAR | Status: DC | PRN
  Administered 2016-10-10: 30 mg via INTRAVENOUS

## 2016-10-10 MED ORDER — ENOXAPARIN SODIUM 40 MG/0.4ML ~~LOC~~ SOLN
40.0000 mg | SUBCUTANEOUS | Status: AC
  Administered 2016-10-10: 40 mg via SUBCUTANEOUS
  Filled 2016-10-10: qty 0.4

## 2016-10-10 MED ORDER — BUPIVACAINE LIPOSOME 1.3 % IJ SUSP
20.0000 mL | Freq: Once | INTRAMUSCULAR | Status: AC
  Administered 2016-10-10: 20 mL
  Filled 2016-10-10: qty 20

## 2016-10-10 MED ORDER — ONDANSETRON HCL 4 MG/2ML IJ SOLN: 4.0000 mg | Freq: Four times a day (QID) | INTRAMUSCULAR | Status: DC | PRN

## 2016-10-10 MED ORDER — SCOPOLAMINE 1 MG/3DAYS TD PT72
MEDICATED_PATCH | TRANSDERMAL | Status: DC | PRN
  Administered 2016-10-10: 1 via TRANSDERMAL

## 2016-10-10 MED ORDER — KCL IN DEXTROSE-NACL 20-5-0.45 MEQ/L-%-% IV SOLN
INTRAVENOUS | Status: AC
  Filled 2016-10-10: qty 1000

## 2016-10-10 MED ORDER — MEPERIDINE HCL 50 MG/ML IJ SOLN: 6.2500 mg | INTRAMUSCULAR | Status: DC | PRN

## 2016-10-10 MED ORDER — FENTANYL CITRATE (PF) 100 MCG/2ML IJ SOLN
INTRAMUSCULAR | Status: DC | PRN
  Administered 2016-10-10 (×2): 50 ug via INTRAVENOUS
  Administered 2016-10-10: 25 ug via INTRAVENOUS

## 2016-10-10 MED ORDER — LIDOCAINE 2% (20 MG/ML) 5 ML SYRINGE
INTRAMUSCULAR | Status: DC | PRN
  Administered 2016-10-10: 50 mg via INTRAVENOUS

## 2016-10-10 MED ORDER — HYDROMORPHONE HCL 1 MG/ML IJ SOLN
INTRAMUSCULAR | Status: AC
  Filled 2016-10-10: qty 1

## 2016-10-10 MED ORDER — KCL IN DEXTROSE-NACL 20-5-0.45 MEQ/L-%-% IV SOLN
INTRAVENOUS | Status: DC
  Administered 2016-10-10: 11:00:00 via INTRAVENOUS
  Filled 2016-10-10 (×2): qty 1000

## 2016-10-10 MED ORDER — PHENYLEPHRINE 40 MCG/ML (10ML) SYRINGE FOR IV PUSH (FOR BLOOD PRESSURE SUPPORT)
PREFILLED_SYRINGE | INTRAVENOUS | Status: AC
  Filled 2016-10-10: qty 10

## 2016-10-10 MED ORDER — DEXTROSE 5 % IV SOLN
2.0000 g | INTRAVENOUS | Status: AC
  Administered 2016-10-10: 2 g via INTRAVENOUS
  Filled 2016-10-10: qty 2

## 2016-10-10 MED ORDER — CLONAZEPAM 0.5 MG PO TABS: 0.5000 mg | ORAL_TABLET | Freq: Two times a day (BID) | ORAL | Status: DC | PRN

## 2016-10-10 MED ORDER — GABAPENTIN 300 MG PO CAPS
600.0000 mg | ORAL_CAPSULE | Freq: Every day | ORAL | Status: AC
  Administered 2016-10-10 – 2016-10-11 (×2): 600 mg via ORAL
  Filled 2016-10-10 (×2): qty 2

## 2016-10-10 MED ORDER — DIPHENHYDRAMINE HCL 50 MG/ML IJ SOLN
INTRAMUSCULAR | Status: AC
  Filled 2016-10-10: qty 1

## 2016-10-10 MED ORDER — EPHEDRINE 5 MG/ML INJ
INTRAVENOUS | Status: AC
  Filled 2016-10-10: qty 10

## 2016-10-10 MED ORDER — BUPIVACAINE HCL 0.25 % IJ SOLN
INTRAMUSCULAR | Status: DC | PRN
  Administered 2016-10-10: 20 mL

## 2016-10-10 MED ORDER — MIDAZOLAM HCL 2 MG/2ML IJ SOLN: 0.5000 mg | Freq: Once | INTRAMUSCULAR | Status: DC | PRN

## 2016-10-10 MED ORDER — HYDROMORPHONE HCL 2 MG PO TABS
2.0000 mg | ORAL_TABLET | ORAL | Status: DC | PRN
  Administered 2016-10-10 – 2016-10-12 (×10): 2 mg via ORAL
  Filled 2016-10-10 (×11): qty 1

## 2016-10-10 MED ORDER — MIDAZOLAM HCL 5 MG/5ML IJ SOLN
INTRAMUSCULAR | Status: DC | PRN
  Administered 2016-10-10: 2 mg via INTRAVENOUS

## 2016-10-10 MED ORDER — SODIUM CHLORIDE 0.9 % IV BOLUS (SEPSIS)
500.0000 mL | Freq: Once | INTRAVENOUS | Status: AC
  Administered 2016-10-10: 500 mL via INTRAVENOUS

## 2016-10-10 MED ORDER — PROPOFOL 10 MG/ML IV BOLUS
INTRAVENOUS | Status: AC
  Filled 2016-10-10: qty 20

## 2016-10-10 MED ORDER — SERTRALINE HCL 100 MG PO TABS
100.0000 mg | ORAL_TABLET | Freq: Every day | ORAL | Status: DC
  Administered 2016-10-11 – 2016-10-12 (×2): 100 mg via ORAL
  Filled 2016-10-10 (×2): qty 1

## 2016-10-10 MED ORDER — DIPHENHYDRAMINE HCL 50 MG/ML IJ SOLN
INTRAMUSCULAR | Status: DC | PRN
  Administered 2016-10-10: 12.5 mg via INTRAVENOUS

## 2016-10-10 MED ORDER — PROMETHAZINE HCL 25 MG/ML IJ SOLN: 6.2500 mg | INTRAMUSCULAR | Status: DC | PRN

## 2016-10-10 MED ORDER — ROCURONIUM BROMIDE 50 MG/5ML IV SOSY
PREFILLED_SYRINGE | INTRAVENOUS | Status: AC
  Filled 2016-10-10: qty 5

## 2016-10-10 MED ORDER — PROPOFOL 10 MG/ML IV BOLUS
INTRAVENOUS | Status: DC | PRN
  Administered 2016-10-10: 150 mg via INTRAVENOUS

## 2016-10-10 MED ORDER — FENTANYL CITRATE (PF) 100 MCG/2ML IJ SOLN
INTRAMUSCULAR | Status: AC
  Filled 2016-10-10: qty 2

## 2016-10-10 MED ORDER — SCOPOLAMINE 1 MG/3DAYS TD PT72
MEDICATED_PATCH | TRANSDERMAL | Status: AC
  Filled 2016-10-10: qty 1

## 2016-10-10 MED ORDER — ONDANSETRON HCL 4 MG/2ML IJ SOLN
INTRAMUSCULAR | Status: AC
  Filled 2016-10-10: qty 2

## 2016-10-10 MED ORDER — LACTATED RINGERS IV SOLN
INTRAVENOUS | Status: DC | PRN
  Administered 2016-10-10 (×2): via INTRAVENOUS

## 2016-10-10 MED ORDER — MIDAZOLAM HCL 2 MG/2ML IJ SOLN
INTRAMUSCULAR | Status: AC
  Filled 2016-10-10: qty 2

## 2016-10-10 MED ORDER — ONDANSETRON HCL 4 MG PO TABS: 4.0000 mg | ORAL_TABLET | Freq: Four times a day (QID) | ORAL | Status: DC | PRN

## 2016-10-10 MED ORDER — METOCLOPRAMIDE HCL 5 MG/ML IJ SOLN
INTRAMUSCULAR | Status: DC | PRN
  Administered 2016-10-10: 10 mg via INTRAVENOUS

## 2016-10-10 MED ORDER — LIDOCAINE 2% (20 MG/ML) 5 ML SYRINGE
INTRAMUSCULAR | Status: AC
Start: 2016-10-10 — End: 2016-10-10
  Filled 2016-10-10: qty 5

## 2016-10-10 MED ORDER — METOCLOPRAMIDE HCL 5 MG/ML IJ SOLN
INTRAMUSCULAR | Status: AC
  Filled 2016-10-10: qty 2

## 2016-10-10 MED ORDER — PHENYLEPHRINE 40 MCG/ML (10ML) SYRINGE FOR IV PUSH (FOR BLOOD PRESSURE SUPPORT)
PREFILLED_SYRINGE | INTRAVENOUS | Status: DC | PRN
  Administered 2016-10-10: 40 ug via INTRAVENOUS
  Administered 2016-10-10 (×2): 80 ug via INTRAVENOUS
  Administered 2016-10-10: 40 ug via INTRAVENOUS

## 2016-10-10 MED ORDER — EPHEDRINE SULFATE-NACL 50-0.9 MG/10ML-% IV SOSY
PREFILLED_SYRINGE | INTRAVENOUS | Status: DC | PRN
  Administered 2016-10-10 (×2): 5 mg via INTRAVENOUS
  Administered 2016-10-10: 10 mg via INTRAVENOUS

## 2016-10-10 MED ORDER — SENNOSIDES-DOCUSATE SODIUM 8.6-50 MG PO TABS
2.0000 | ORAL_TABLET | Freq: Every day | ORAL | Status: DC
  Administered 2016-10-10 – 2016-10-11 (×2): 2 via ORAL
  Filled 2016-10-10 (×2): qty 2

## 2016-10-10 MED ORDER — KETAMINE HCL 10 MG/ML IJ SOLN
INTRAMUSCULAR | Status: AC
  Filled 2016-10-10: qty 1

## 2016-10-10 MED ORDER — BUPIVACAINE HCL (PF) 0.25 % IJ SOLN
INTRAMUSCULAR | Status: AC
  Filled 2016-10-10: qty 30

## 2016-10-10 MED ORDER — ENSURE ENLIVE PO LIQD
237.0000 mL | Freq: Two times a day (BID) | ORAL | Status: DC
  Administered 2016-10-11 – 2016-10-12 (×2): 237 mL via ORAL

## 2016-10-10 MED ORDER — KETAMINE HCL 10 MG/ML IJ SOLN
INTRAMUSCULAR | Status: DC | PRN
  Administered 2016-10-10: 4 mg via INTRAVENOUS
  Administered 2016-10-10: 40 mg via INTRAVENOUS
  Administered 2016-10-10: 3 mg via INTRAVENOUS
  Administered 2016-10-10: 4 mg via INTRAVENOUS
  Administered 2016-10-10: 3 mg via INTRAVENOUS

## 2016-10-10 MED ORDER — ROCURONIUM BROMIDE 50 MG/5ML IV SOSY
PREFILLED_SYRINGE | INTRAVENOUS | Status: DC | PRN
  Administered 2016-10-10: 10 mg via INTRAVENOUS
  Administered 2016-10-10: 50 mg via INTRAVENOUS
  Administered 2016-10-10: 10 mg via INTRAVENOUS

## 2016-10-10 MED ORDER — LEVOTHYROXINE SODIUM 25 MCG PO TABS
25.0000 ug | ORAL_TABLET | Freq: Every day | ORAL | Status: DC
  Administered 2016-10-11 – 2016-10-12 (×2): 25 ug via ORAL
  Filled 2016-10-10 (×2): qty 1

## 2016-10-10 MED ORDER — ONDANSETRON HCL 4 MG/2ML IJ SOLN
INTRAMUSCULAR | Status: DC | PRN
  Administered 2016-10-10: 4 mg via INTRAVENOUS

## 2016-10-10 MED ORDER — KETOROLAC TROMETHAMINE 15 MG/ML IJ SOLN
15.0000 mg | Freq: Four times a day (QID) | INTRAMUSCULAR | Status: AC
  Administered 2016-10-10 – 2016-10-11 (×4): 15 mg via INTRAVENOUS
  Filled 2016-10-10 (×4): qty 1

## 2016-10-10 MED ORDER — SODIUM CHLORIDE 0.9 % IJ SOLN
INTRAMUSCULAR | Status: AC
  Filled 2016-10-10: qty 50

## 2016-10-10 MED ORDER — HYDROMORPHONE HCL 2 MG/ML IJ SOLN
0.5000 mg | INTRAMUSCULAR | Status: DC | PRN
  Administered 2016-10-10: 0.5 mg via INTRAVENOUS
  Filled 2016-10-10 (×2): qty 1

## 2016-10-10 MED ORDER — ACETAMINOPHEN 500 MG PO TABS
1000.0000 mg | ORAL_TABLET | Freq: Four times a day (QID) | ORAL | Status: DC
  Administered 2016-10-10 – 2016-10-12 (×8): 1000 mg via ORAL
  Filled 2016-10-10 (×8): qty 2

## 2016-10-10 MED ORDER — SUGAMMADEX SODIUM 200 MG/2ML IV SOLN
INTRAVENOUS | Status: AC
  Filled 2016-10-10: qty 2

## 2016-10-10 SURGICAL SUPPLY — 61 items
ADH SKN CLS APL DERMABOND .7 (GAUZE/BANDAGES/DRESSINGS)
AGENT HMST MTR 8 SURGIFLO (HEMOSTASIS)
ATTRACTOMAT 16X20 MAGNETIC DRP (DRAPES) ×2 IMPLANT
BLADE EXTENDED COATED 6.5IN (ELECTRODE) ×2 IMPLANT
CELLS DAT CNTRL 66122 CELL SVR (MISCELLANEOUS) IMPLANT
CHLORAPREP W/TINT 26ML (MISCELLANEOUS) ×2 IMPLANT
CLIP TI LARGE 6 (CLIP) ×2 IMPLANT
CLIP TI MEDIUM 6 (CLIP) ×2 IMPLANT
CLIP TI MEDIUM LARGE 6 (CLIP) ×2 IMPLANT
CONT SPEC 4OZ CLIKSEAL STRL BL (MISCELLANEOUS) ×1 IMPLANT
COVER SURGICAL LIGHT HANDLE (MISCELLANEOUS) ×2 IMPLANT
DERMABOND ADVANCED (GAUZE/BANDAGES/DRESSINGS)
DERMABOND ADVANCED .7 DNX12 (GAUZE/BANDAGES/DRESSINGS) IMPLANT
DRAPE INCISE IOBAN 66X45 STRL (DRAPES) ×2 IMPLANT
DRAPE WARM FLUID 44X44 (DRAPE) ×2 IMPLANT
ELECT REM PT RETURN 9FT ADLT (ELECTROSURGICAL) ×2
ELECTRODE REM PT RTRN 9FT ADLT (ELECTROSURGICAL) ×1 IMPLANT
GAUZE SPONGE 4X4 16PLY XRAY LF (GAUZE/BANDAGES/DRESSINGS) IMPLANT
GLOVE BIO SURGEON STRL SZ 6 (GLOVE) ×4 IMPLANT
GLOVE BIO SURGEON STRL SZ 6.5 (GLOVE) ×4 IMPLANT
GOWN STRL REUS W/ TWL LRG LVL3 (GOWN DISPOSABLE) ×3 IMPLANT
GOWN STRL REUS W/TWL LRG LVL3 (GOWN DISPOSABLE) ×6
HEMOSTAT ARISTA ABSORB 3G PWDR (MISCELLANEOUS) IMPLANT
KIT BASIN OR (CUSTOM PROCEDURE TRAY) ×2 IMPLANT
LIGASURE IMPACT 36 18CM CVD LR (INSTRUMENTS) ×1 IMPLANT
LIQUID BAND (GAUZE/BANDAGES/DRESSINGS) ×2 IMPLANT
LOOP VESSEL MAXI BLUE (MISCELLANEOUS) IMPLANT
NEEDLE HYPO 22GX1.5 SAFETY (NEEDLE) ×4 IMPLANT
NS IRRIG 1000ML POUR BTL (IV SOLUTION) ×4 IMPLANT
PACK GENERAL/GYN (CUSTOM PROCEDURE TRAY) ×2 IMPLANT
RELOAD PROXIMATE 75MM BLUE (ENDOMECHANICALS) IMPLANT
RELOAD PROXIMATE TA60MM BLUE (ENDOMECHANICALS) IMPLANT
RELOAD STAPLE 60 BLU REG PROX (ENDOMECHANICALS) IMPLANT
RELOAD STAPLE 75 3.8 BLU REG (ENDOMECHANICALS) IMPLANT
RETRACTOR WND ALEXIS 18 MED (MISCELLANEOUS) IMPLANT
RETRACTOR WND ALEXIS 25 LRG (MISCELLANEOUS) ×1 IMPLANT
RTRCTR WOUND ALEXIS 18CM MED (MISCELLANEOUS)
RTRCTR WOUND ALEXIS 25CM LRG (MISCELLANEOUS) ×2
SHEET LAVH (DRAPES) ×2 IMPLANT
SPOGE SURGIFLO 8M (HEMOSTASIS)
SPONGE LAP 18X18 X RAY DECT (DISPOSABLE) IMPLANT
SPONGE SURGIFLO 8M (HEMOSTASIS) IMPLANT
STAPLER GUN LINEAR PROX 60 (STAPLE) IMPLANT
STAPLER PROXIMATE 75MM BLUE (STAPLE) IMPLANT
STAPLER VISISTAT 35W (STAPLE) IMPLANT
SUT MNCRL AB 4-0 PS2 18 (SUTURE) ×4 IMPLANT
SUT PDS AB 1 CT1 27 (SUTURE) ×2 IMPLANT
SUT PDS AB 1 TP1 96 (SUTURE) ×4 IMPLANT
SUT SILK 3 0 SH CR/8 (SUTURE) ×2 IMPLANT
SUT VIC AB 0 CT1 36 (SUTURE) ×8 IMPLANT
SUT VIC AB 2-0 CT1 36 (SUTURE) ×4 IMPLANT
SUT VIC AB 2-0 CT2 27 (SUTURE) ×12 IMPLANT
SUT VIC AB 3-0 CTX 36 (SUTURE) IMPLANT
SUT VIC AB 3-0 SH 18 (SUTURE) IMPLANT
SUT VIC AB 3-0 SH 27 (SUTURE) ×2
SUT VIC AB 3-0 SH 27X BRD (SUTURE) ×1 IMPLANT
SYR 30ML LL (SYRINGE) ×4 IMPLANT
TOWEL OR 17X26 10 PK STRL BLUE (TOWEL DISPOSABLE) ×2 IMPLANT
TOWEL OR NON WOVEN STRL DISP B (DISPOSABLE) ×2 IMPLANT
TRAY FOLEY W/METER SILVER 16FR (SET/KITS/TRAYS/PACK) ×2 IMPLANT
UNDERPAD 30X30 INCONTINENT (UNDERPADS AND DIAPERS) ×2 IMPLANT

## 2016-10-10 NOTE — Anesthesia Postprocedure Evaluation (Signed)
Anesthesia Post Note  Patient: Kendra Knight  Procedure(s) Performed: Procedure(s) (LRB): EXPLORATORY LAPAROTOMY WITH LEFT SALPINGO OPPHERECTOMY, LYSIS OF ADHESIONS (N/A)  Patient location during evaluation: PACU Anesthesia Type: General Level of consciousness: oriented, patient cooperative and sedated Pain management: pain level controlled Vital Signs Assessment: post-procedure vital signs reviewed and stable Respiratory status: spontaneous breathing, nonlabored ventilation, respiratory function stable and patient connected to nasal cannula oxygen Cardiovascular status: blood pressure returned to baseline and stable Postop Assessment: no signs of nausea or vomiting Anesthetic complications: no       Last Vitals:  Vitals:   10/10/16 1045 10/10/16 1152  BP: 133/60 (!) 127/56  Pulse: 81 77  Resp: 14 14  Temp: 37.3 C 37.3 C    Last Pain:  Vitals:   10/10/16 1317  TempSrc:   PainSc: 4                  Kairav Russomanno,E. Clemmie Marxen

## 2016-10-10 NOTE — H&P (Signed)
Assessment/Plan:  [name]  is a 46 y.o.  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  I reviewed her CT images with the patient from 07/26/16. I discussed that I recommend its removal given its size and its symptomatic.  I discussed that resection would require ex lap via a midline vertical incision with resection of the mass, possible oophorectomy, lysis of adhesions, possible small bowel or colonic resection and possible stoma. I discussed that I recommended BSO to reduce the likelihood of recurrence, however the patient strongly desires to keep her ovaries provided they are normal in appearance (at least one).  I discussed that her surgery would be associated with substantial risk given the size and complexity of the mass and the history of multiple prior surgeries. I discussed operative risks including  bleeding, infection, damage to internal organs (such as bladder,ureters, bowels), blood clot, reoperation and rehospitalization. I discussed that if colonic resection is necessary she may require stoma formation temporarily. I discussed that urologic surgery (such as ureteral re-implantation or bladder repair) may also be necessary.  She would like to proceed in early January as she has a court date prior to that time.  I discussed anticipated hospital stay and recovery and restrictions.   She has a sore throat and requested empiric amoxacillin.Likely viral infection.   HPI: Kendra Knight is a 46 year old parous woman who is seen in consultation at the request of Dr Elonda Husky for a recurrent cystic pelvic mass. 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 She had a hysterectomy approximately 13 years ago. Her surgeyr in 2004 was associated with a bowel injury to the sigmoid colon requiring resection and anastamosis.   The pathology from the 2008 surgery revealed a mass  consisting of ovarian type stroma and the lumen of the cystic cavity is lined by non-atypical mucinous epithelium. A portion of the mass had areas of benign smooth muscle. The ovaries were grossly not involved or attached to the mass. 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   The patient began experiencing fullness and pressure and in October, 2017 began appreciated left sided flank pains. She was suspicious about recurrence of her cystadenoma as was seen by Dr Elonda Husky who performed CT abdo/pelvis on 07/26/16. This showed a multilobulated cystic mass with multiple thin enhancing septations in the left lower quadrant, left adnexa and central pelvis. It measured 16.8x9.6x12.1cm. 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   Current Meds:      Outpatient Encounter Prescriptions as of 08/14/2016  Medication Sig  . ketorolac (TORADOL) 10 MG tablet Take 1 tablet (10 mg total) by mouth every 8 (eight) hours as needed.  Marland Kitchen levothyroxine (SYNTHROID, LEVOTHROID) 25 MCG tablet TAKE 1 TABLET DAILY BEFORE BREAKFAST  . sertraline (ZOLOFT) 100 MG tablet TAKE 1 TABLET DAILY  . amoxicillin (AMOXIL) 250 MG capsule Take 1 capsule (250 mg total) by mouth 3 (three) times daily.   No facility-administered encounter medications on file as of 08/14/2016.     Allergy:       Allergies  Allergen Reactions  . Buprenex [Buprenorphine] Anaphylaxis    Pt had Acute Respiratory Failure with this drug x2  . Codeine Nausea And Vomiting  . Darvocet [Propoxyphene N-Acetaminophen] Nausea And Vomiting  .  Erythromycin Nausea And Vomiting  . Hydrocodone Nausea And Vomiting  . Prednisone   . Stadol [Butorphanol] Rash    Social Hx:   Social History        Social History  . Marital status: Married    Spouse name: N/A  . Number of children: N/A  .  Years of education: N/A      Occupational History  . Not on file.        Social History Main Topics  . Smoking status: Never Smoker  . Smokeless tobacco: Never Used  . Alcohol use Not on file  . Drug use: Unknown  . Sexual activity: Yes    Birth control/ protection: Surgical       Other Topics Concern  . Not on file      Social History Narrative  . No narrative on file    Past Surgical Hx:       Past Surgical History:  Procedure Laterality Date  . ABDOMINAL HYSTERECTOMY    . ABDOMINAL HYSTERECTOMY     ABDOMINAL HYSTERECTOMY  . CROTHERAPY    . DILATION AND CURETTAGE OF UTERUS    . TUBAL LIGATION      Past Medical Hx:      Past Medical History:  Diagnosis Date  . Anxiety   . BV (bacterial vaginosis) 11/17/2013  . Vaginal discharge 11/17/2013    Past Gynecological History:  Cesarean section x 2 No LMP recorded. Patient has had a hysterectomy.  Family Hx:        Family History  Problem Relation Age of Onset  . Hypertension Mother   . Heart disease Brother   . Cancer Maternal Uncle     brain  . Heart disease Maternal Grandfather   . Heart disease Paternal Grandfather     heart attack    Review of Systems:  Constitutional  Feels well,    ENT + sore throat Skin/Breast  No rash, sores, jaundice, itching, dryness Cardiovascular  No chest pain, shortness of breath, or edema  Pulmonary  No cough or wheeze.  Gastro Intestinal  No nausea, vomitting, or diarrhoea. No bright red blood per rectum, no abdominal pain, change in bowel movement, or constipation.  Genito Urinary  No frequency, urgency, dysuria, no bleeding, Musculo Skeletal  No myalgia, arthralgia, joint swelling or pain  Neurologic  No weakness, numbness, change in gait,  Psychology  No depression, anxiety, insomnia.   Vitals:  Blood pressure (!) 154/86, pulse 69, temperature 98.4 F (36.9 C), temperature source Oral, resp. rate 18, height 5\' 3"  (1.6  m), weight 147 lb 14.4 oz (67.1 kg), SpO2 100 %.  Physical Exam: WD in NAD Neck  Mild tonsilar erythema without exudate Lymph Node Survey No cervical supraclavicular or inguinal adenopathy Cardiovascular  Pulse normal rate, regularity and rhythm. S1 and S2 normal.  Lungs  Clear to auscultation bilateraly, without wheezes/crackles/rhonchi. Good air movement.  Skin  No rash/lesions/breakdown  Psychiatry  Alert and oriented to person, place, and time  Abdomen  Normoactive bowel sounds, abdomen soft, non-tender and thin without evidence of hernia.  The mass is not appreciated well on abdominal exam. Back No CVA tenderness Genito Urinary  Vulva/vagina: Normal external female genitalia.  No lesions. No discharge or bleeding.             Bladder/urethra:  No lesions or masses, well supported bladder             Vagina: normal  Cervix and uterus surgically absent             Adnexa: cystic fullness at the vaginal cuff appreciated. Somewhat mobile, very compressible.  Rectal  Good tone, no masses no cul de sac nodularity.  Extremities  No bilateral cyanosis, clubbing or edema.  Donaciano Eva, MD

## 2016-10-10 NOTE — Op Note (Signed)
OPERATIVE NOTE Date: 10/10/16 Preoperative Diagnosis:  Pelvic mass   Postoperative Diagnosis:left ovarian benign cystic mass and peritoneal inclusion cysts    Procedure(s) Performed: Exploratory laparotomy with left salpingo-oophorectomy and lysis of adhesions.  Surgeon: Thereasa Solo, MD.  Assistant Surgeon: Lahoma Crocker, M.D. Assistant: (an MD assistant was necessary for tissue manipulation, retraction and positioning due to the complexity of the case and hospital policies).   Specimens: left tube and ovary, washings   Estimated Blood Loss: 100 mL.    Urine [id number]  Complications: None.   Operative Findings: multiple peritoneal inclusion cysts between the sigmoid colon, left ovary and side wall and cul de sac. Sigmoid colon densely adherent to left ovary and bladder. Benign frozen section of enlarged left ovary.  Right ovary not visible or palpable, possibly surgically absent.   Procedure:   The patient was seen in the Holding Room. The risks, benefits, complications, treatment options, and expected outcomes were discussed with the patient.  The patient concurred with the proposed plan, giving informed consent.   The patient was  identified as Kendra Knight  and the procedure verified as ex lap resection of pelvic mass, possible USO, possible staging. A Time Out was held and the above information confirmed upon entry to the operating room..  After induction of anesthesia, the patient was draped and prepped in the usual sterile manner.  She was prepped and draped in the normal sterile fashion in the dorsal lithotomy position in padded Allen stirrups with good attention paid to support of the lower back and lower extremities. Position was adjusted for appropriate support. A Foley catheter was placed to gravity.   A midline vertical incision was made and carried through the subcutaneous tissue to the fascia. The fascial incision was made and extended superiorally. The rectus muscles  were separated. The peritoneum was identified and entered. Peritoneal incision was extended longitudinally.  The abdominal cavity was entered sharply and without incident. A Bookwalter retractor was then placed. A survey of the abdomen and pelvis revealed the above findings, which were significant for peritoneal inclusion cysts, a 5cm bulky cystic left ovary and a densely adherent sigmoid colon.  Washings were obtained from the peritoneal cavity.  An alexis retractor was placed.  For 60 minutes sharp adhesiolysis with the scissors took place to lyse adhesions between the sigmoid colon and the pelvic mass and bladder. In doing so the peritoneal inclusion cysts were opened and drained. The ovary on the left was separated from the sigmoid colon with sharp dissection.  The left retroperitoneal space was entered parallel to the IP ligament.  A window was made in the medial leaf of the broad ligament above the level of the ureter. The left sided attachments to the vagina and infundibulopelvic ligament were skeletonized and sealed and transected with the ligasure sealing device. This liberated the ovary from its attachments. It was sent for frozen section which revealed a benign left ovarian cyst. Hemostasis was observed at the utero-ovarian and IP ligament.  The peritoneal cavity was irrigated and hemostasis was confirmed at all surgical sites.  The fascia was reapproximated with 0 looped PDS using a total of two sutures. The subcutaneous layer was then irrigated copiously.  Exparel long acting local anesthetic was infiltrated into the subcutaneous tissues. The skin was closed with subcuticular suture. The patient tolerated the procedure well.   Sponge, lap and needle counts were correct x 2.   Donaciano Eva, MD

## 2016-10-10 NOTE — Progress Notes (Signed)
Attempted to ambulate pt at 1530. Pt became diaphoretic and stated she felt dizzy. Pt had a syncopal episode and was put in the chair. Vitals were taken 95/40 Pulse 54 R 14 99% on RA. NP paged and received orders for 500 ml of NS bolus. Pt resting in chair. Will continue to monitor patient.

## 2016-10-10 NOTE — Anesthesia Procedure Notes (Signed)
Procedure Name: Intubation Date/Time: 10/10/2016 7:39 AM Performed by: Deliah Boston Pre-anesthesia Checklist: Patient identified, Emergency Drugs available, Suction available and Patient being monitored Patient Re-evaluated:Patient Re-evaluated prior to inductionOxygen Delivery Method: Circle system utilized Preoxygenation: Pre-oxygenation with 100% oxygen Intubation Type: IV induction Ventilation: Mask ventilation without difficulty Laryngoscope Size: Mac and 3 Grade View: Grade I Tube type: Oral Tube size: 7.0 mm Number of attempts: 1 Airway Equipment and Method: Stylet and Oral airway Placement Confirmation: ETT inserted through vocal cords under direct vision,  positive ETCO2 and breath sounds checked- equal and bilateral Secured at: 20 cm Tube secured with: Tape Dental Injury: Teeth and Oropharynx as per pre-operative assessment

## 2016-10-10 NOTE — Anesthesia Preprocedure Evaluation (Addendum)
Anesthesia Evaluation  Patient identified by MRN, date of birth, ID band Patient awake    Reviewed: Allergy & Precautions, NPO status , Patient's Chart, lab work & pertinent test results  History of Anesthesia Complications (+) Emergence Delirium  Airway Mallampati: I  TM Distance: >3 FB Neck ROM: Full    Dental  (+) Dental Advisory Given   Pulmonary neg pulmonary ROS,    breath sounds clear to auscultation       Cardiovascular negative cardio ROS   Rhythm:Regular Rate:Normal     Neuro/Psych negative neurological ROS     GI/Hepatic negative GI ROS, Neg liver ROS,   Endo/Other  Hypothyroidism   Renal/GU negative Renal ROS     Musculoskeletal   Abdominal   Peds  Hematology negative hematology ROS (+)   Anesthesia Other Findings   Reproductive/Obstetrics                            Anesthesia Physical Anesthesia Plan  ASA: II  Anesthesia Plan: General   Post-op Pain Management:    Induction: Intravenous  Airway Management Planned: Oral ETT  Additional Equipment:   Intra-op Plan:   Post-operative Plan: Extubation in OR  Informed Consent: I have reviewed the patients History and Physical, chart, labs and discussed the procedure including the risks, benefits and alternatives for the proposed anesthesia with the patient or authorized representative who has indicated his/her understanding and acceptance.   Dental advisory given  Plan Discussed with: CRNA and Surgeon  Anesthesia Plan Comments: (Plan routine monitors, GETA)        Anesthesia Quick Evaluation

## 2016-10-10 NOTE — Anesthesia Procedure Notes (Signed)
Performed by: Cedar Roseman C       

## 2016-10-10 NOTE — Progress Notes (Signed)
Kendra Knight made aware patient with episode of fainting and bp of 95/40.  Orders received

## 2016-10-10 NOTE — Transfer of Care (Signed)
Immediate Anesthesia Transfer of Care Note  Patient: Kendra Knight  Procedure(s) Performed: Procedure(s): EXPLORATORY LAPAROTOMY WITH LEFT SALPINGO OPPHERECTOMY, LYSIS OF ADHESIONS (N/A)  Patient Location: PACU  Anesthesia Type:General  Level of Consciousness: Patient easily awoken, sedated, comfortable, cooperative, following commands, responds to stimulation.   Airway & Oxygen Therapy: Patient spontaneously breathing, ventilating well, oxygen via simple oxygen mask.  Post-op Assessment: Report given to PACU RN, vital signs reviewed and stable, moving all extremities.   Post vital signs: Reviewed and stable.  Complications: No apparent anesthesia complications  Last Vitals:  Vitals:   10/10/16 0553 10/10/16 0950  BP: (!) 143/86 (P) 138/74  Pulse: (!) 57 (P) 90  Resp: 16 (P) 14  Temp: 37.1 C (P) 37.1 C    Last Pain:  Vitals:   10/10/16 0601  TempSrc:   PainSc: 1       Patients Stated Pain Goal: 3 ([id number])  Complications: No apparent anesthesia complications

## 2016-10-11 ENCOUNTER — Encounter (HOSPITAL_COMMUNITY): Payer: Self-pay | Admitting: Gynecologic Oncology

## 2016-10-11 LAB — BASIC METABOLIC PANEL
Anion gap: 4 — ABNORMAL LOW (ref 5–15)
BUN: 9 mg/dL (ref 6–20)
CHLORIDE: 108 mmol/L (ref 101–111)
CO2: 26 mmol/L (ref 22–32)
CREATININE: 0.8 mg/dL (ref 0.44–1.00)
Calcium: 8.2 mg/dL — ABNORMAL LOW (ref 8.9–10.3)
GFR calc non Af Amer: >60 mL/min (ref >60)
Glucose, Bld: 110 mg/dL — ABNORMAL HIGH (ref 65–99)
POTASSIUM: 4.2 mmol/L (ref 3.5–5.1)
Sodium: 138 mmol/L (ref 135–145)

## 2016-10-11 LAB — CBC
HEMATOCRIT: 29.9 % — AB (ref 36.0–46.0)
HEMOGLOBIN: 10 g/dL — AB (ref 12.0–15.0)
MCH: 30.9 pg (ref 26.0–34.0)
MCHC: 33.4 g/dL (ref 30.0–36.0)
MCV: 92.3 fL (ref 78.0–100.0)
Platelets: 215 10*3/uL (ref 150–400)
RBC: 3.24 MIL/uL — AB (ref 3.87–5.11)
RDW: 12.8 % (ref 11.5–15.5)
WBC: 6.2 10*3/uL (ref 4.0–10.5)

## 2016-10-11 NOTE — Discharge Summary (Signed)
Physician Discharge Summary  Patient ID: Kendra Knight MRN: [id number] DOB/AGE: 12/29/1970 46 y.o.  Admit date: 10/10/2016 Discharge date: 10/12/2016  Admission Diagnoses: Pelvic mass  Discharge Diagnoses:  Principal Problem:   Pelvic mass Active Problems:   Pelvic mass in female   Discharged Condition: good  Hospital Course:  1/ patient was admitted on 10/10/16 for ex lap, left salpingo oophorectomy and lysis of adhesions for a complex pelvic mass. 2/ surgery was uncomplicated with benign pathology on frozen section 3/ Postoperatively she had a vaso-vagal episode with standing on POD0, however this resolved. 4/ she developed postop anemia on POD 1 (hb 10), however had stable hemodynamics. 5/ On POD 2 she was meeting discharge criteria - pain well controlled, tolerating PO, voiding urine, passing flatus.   Consults: None  Significant Diagnostic Studies: labs:  CBC    Component Value Date/Time   WBC 5.8 10/12/2016 0701   RBC 3.20 (L) 10/12/2016 0701   HGB 10.0 (L) 10/12/2016 0701   HCT 29.5 (L) 10/12/2016 0701   PLT 224 10/12/2016 0701   MCV 92.2 10/12/2016 0701   MCH 31.3 10/12/2016 0701   MCHC 33.9 10/12/2016 0701   RDW 12.6 10/12/2016 0701   LYMPHSABS 2.1 10/09/2016 0926   MONOABS 0.3 10/09/2016 0926   EOSABS 0.1 10/09/2016 0926   BASOSABS 0.0 10/09/2016 0926   (Hb stable over 24 hours)  Treatments: surgery: see above  Discharge Exam: Blood pressure 134/70, pulse 64, temperature 97.9 F (36.6 C), temperature source Oral, resp. rate 16, height 5\' 3"  (1.6 m), weight 152 lb (68.9 kg), SpO2 99 %. General appearance: alert and cooperative GI: soft, non-tender; bowel sounds normal; no masses,  no organomegaly Incision/Wound: clean and intact with suture, dermabond and dressing  Disposition:   Discharge Instructions    (HEART FAILURE PATIENTS) Call MD:  Anytime you have any of the following symptoms: 1) 3 pound weight gain in 24 hours or 5 pounds in 1 week 2)  shortness of breath, with or without a dry hacking cough 3) swelling in the hands, feet or stomach 4) if you have to sleep on extra pillows at night in order to breathe.    Complete by:  As directed    Call MD for:  difficulty breathing, headache or visual disturbances    Complete by:  As directed    Call MD for:  extreme fatigue    Complete by:  As directed    Call MD for:  hives    Complete by:  As directed    Call MD for:  persistant dizziness or light-headedness    Complete by:  As directed    Call MD for:  persistant nausea and vomiting    Complete by:  As directed    Call MD for:  redness, tenderness, or signs of infection (pain, swelling, redness, odor or green/yellow discharge around incision site)    Complete by:  As directed    Call MD for:  severe uncontrolled pain    Complete by:  As directed    Call MD for:  temperature >100.4    Complete by:  As directed    Diet - low sodium heart healthy    Complete by:  As directed    Diet general    Complete by:  As directed    Driving Restrictions    Complete by:  As directed    No driving for 7 days or until off narcotic pain medication   Increase activity slowly  Complete by:  As directed    Remove dressing in 24 hours    Complete by:  As directed    Sexual Activity Restrictions    Complete by:  As directed    No intercourse for 6 weeks     Allergies as of 10/12/2016      Reactions   Buprenex [buprenorphine] Anaphylaxis   Pt had Acute Respiratory Failure with this drug x2   Codeine Nausea And Vomiting   Darvocet [propoxyphene N-acetaminophen] Nausea And Vomiting   Erythromycin Nausea And Vomiting   Hydrocodone Nausea And Vomiting   Prednisone Other (See Comments)   Panic attacks and suicidal thoughts   Stadol [butorphanol] Rash      Medication List    TAKE these medications   acetaminophen 500 MG tablet Commonly known as:  TYLENOL Take 2 tablets (1,000 mg total) by mouth every 6 (six) hours.   amoxicillin 250  MG capsule Commonly known as:  AMOXIL Take 1 capsule (250 mg total) by mouth 3 (three) times daily.   cetirizine 10 MG tablet Commonly known as:  ZYRTEC Take 10 mg by mouth daily as needed for allergies.   clonazePAM 0.5 MG tablet Commonly known as:  KLONOPIN Take 1 tablet (0.5 mg total) by mouth 2 (two) times daily as needed for anxiety.   ferrous sulfate 325 (65 FE) MG tablet Take 1 tablet (325 mg total) by mouth 2 (two) times daily with a meal.   HYDROmorphone 2 MG tablet Commonly known as:  DILAUDID Take 1 tablet (2 mg total) by mouth every 4 (four) hours as needed for severe pain.   ketorolac 10 MG tablet Commonly known as:  TORADOL Take 1 tablet (10 mg total) by mouth every 8 (eight) hours as needed. What changed:  reasons to take this   levothyroxine 25 MCG tablet Commonly known as:  SYNTHROID, LEVOTHROID TAKE 1 TABLET DAILY BEFORE BREAKFAST   naproxen sodium 220 MG tablet Commonly known as:  ANAPROX Take 220 mg by mouth 2 (two) times daily as needed (pain).   pseudoephedrine 120 MG 12 hr tablet Commonly known as:  SUDAFED Take 240 mg by mouth every 12 (twelve) hours as needed for congestion.   senna-docusate 8.6-50 MG tablet Commonly known as:  Senokot-S Take 2 tablets by mouth at bedtime.   sertraline 100 MG tablet Commonly known as:  ZOLOFT Take 1 tablet (100 mg total) by mouth daily.      Follow-up Information    Donaciano Eva, MD Follow up on 11/04/2016.   Specialty:  Obstetrics and Gynecology Why:  at 3:15pm at the Practice [facility] for post-op follow up Contact information: [place] [address] [phone]           Signed: Donaciano Eva 10/12/2016, 10:53 AM

## 2016-10-11 NOTE — Progress Notes (Signed)
1 Day Post-Op Procedure(s) (LRB): EXPLORATORY LAPAROTOMY WITH LEFT SALPINGO OPPHERECTOMY, LYSIS OF ADHESIONS (N/A)  Subjective: Patient reports minimal pain. No longer dizzy with ambulation. Not yet voided. Has been passing flatus.    Objective: Vital signs in last 24 hours: Temp:  [98 F (36.7 C)-99.2 F (37.3 C)] 98.3 F (36.8 C) (02/02 0528) Pulse Rate:  [54-77] 56 (02/02 0528) Resp:  [14-18] 18 (02/02 0528) BP: (95-132)/(40-84) 130/84 (02/02 0528) SpO2:  [98 %-100 %] 100 % (02/02 0528) Last BM Date: 10/09/16  Intake/Output from previous day: 02/01 0701 - 02/02 0700 In: 4212.5 [P.O.:960; I.V.:2752.5; IV Piggyback:500] Out: 4000 [Urine:3600; Blood:100]  Physical Examination: General: alert and cooperative Resp: clear to auscultation bilaterally Cardio: regular rate and rhythm, S1, S2 normal, no murmur, click, rub or gallop GI: soft, non-tender; bowel sounds normal; no masses,  no organomegaly incision clean dry and in tact with dressing  Labs: WBC/Hgb/Hct/Plts:  6.2/10.0/29.9/215 (02/02 [id number]) BUN/Cr/glu/ALT/AST/amyl/lip:  9/0.80/--/--/--/--/-- (02/02 [id number])   Assessment:  46 y.o. s/p Procedure(s): EXPLORATORY LAPAROTOMY WITH LEFT SALPINGO OPPHERECTOMY, LYSIS OF ADHESIONS: stable Pain:  Pain is well-controlled on oral medications.  Heme:Anemia: acute postop blood loss anemia. Hemodynamics are stable suggesting no active bleeding. Will recheck in morning.  CV: stable.  GI:  Tolerating po: Yes    FEN: hep lock IVF.  Prophylaxis: pharmacologic prophylaxis (with any of the following: enoxaparin (Lovenox) 40mg  SQ 2 hours prior to surgery then every day).  Plan: Advance diet Encourage ambulation Discontinue IV fluids Dispo:  Discharge plan to include :consults: @CM @, Social Work The patient is to be discharged to home: anticipate discharge on POD 2 (10/12/16).   LOS: 1 day    Donaciano Eva 10/11/2016, 11:48 AM

## 2016-10-11 NOTE — Discharge Instructions (Addendum)
10/11/2016  Return to work: 4-6 weeks if applicable  Activity: 1. Be up and out of the bed during the day.  Take a nap if needed.  You may walk up steps but be careful and use the hand rail.  Stair climbing will tire you more than you think, you may need to stop part way and rest.   2. No lifting or straining for 6 weeks.  3. No driving for 1 week(s).  Do not drive if you are taking narcotic pain medicine.  4. Shower daily.  Use soap and water on your incision and pat dry; don't rub.  No tub baths until cleared by your surgeon.   5. No sexual activity and nothing in the vagina for 2-3 weeks.  6. You may experience a small amount of clear drainage from your incision, which is normal.  If the drainage persists or increases, please call the office.  Diet: 1. Low sodium Heart Healthy Diet is recommended.  2. It is safe to use a laxative, such as Miralax or Colace, if you have difficulty moving your bowels.   Wound Care: 1. Keep clean and dry.  Shower daily.  Medications:  - Take ibuprofen or Anaprox and tylenol first line for pain control. Take these regularly (every 6 hours) to decrease the build up of pain.  - If necessary, for severe pain not relieved by ibuprofen, take Dilaudid.  - While taking percocet you should take sennakot every night to reduce the likelihood of constipation. If this causes diarrhea, stop its use.  - take iron twice a day unless you develop constipation on this.  Reasons to call the Doctor:  Fever - Oral temperature greater than 100.4 degrees Fahrenheit  Foul-smelling vaginal discharge  Difficulty urinating  Nausea and vomiting  Increased pain at the site of the incision that is unrelieved with pain medicine.  Difficulty breathing with or without chest pain  New calf pain especially if only on one side  Sudden, continuing increased vaginal bleeding with or without clots.   Contacts: For questions or concerns you should contact:  Dr. Everitt Amber at [phone]  Joylene John, NP at [phone]  After Hours: call [phone] and have the GYN Oncologist paged/contacted

## 2016-10-12 LAB — CBC
HCT: 29.5 % — ABNORMAL LOW (ref 36.0–46.0)
HEMOGLOBIN: 10 g/dL — AB (ref 12.0–15.0)
MCH: 31.3 pg (ref 26.0–34.0)
MCHC: 33.9 g/dL (ref 30.0–36.0)
MCV: 92.2 fL (ref 78.0–100.0)
Platelets: 224 10*3/uL (ref 150–400)
RBC: 3.2 MIL/uL — AB (ref 3.87–5.11)
RDW: 12.6 % (ref 11.5–15.5)
WBC: 5.8 10*3/uL (ref 4.0–10.5)

## 2016-10-12 MED ORDER — FERROUS SULFATE 325 (65 FE) MG PO TABS: 325.0000 mg | ORAL_TABLET | Freq: Two times a day (BID) | ORAL | 3 refills | Status: DC

## 2016-10-12 MED ORDER — SENNOSIDES-DOCUSATE SODIUM 8.6-50 MG PO TABS: 2.0000 | ORAL_TABLET | Freq: Every day | ORAL | 1 refills | Status: DC

## 2016-10-12 MED ORDER — ACETAMINOPHEN 500 MG PO TABS: 1000.0000 mg | ORAL_TABLET | Freq: Four times a day (QID) | ORAL | 0 refills | Status: AC

## 2016-10-12 MED ORDER — HYDROMORPHONE HCL 2 MG PO TABS: 2.0000 mg | ORAL_TABLET | ORAL | 0 refills | Status: DC | PRN

## 2016-10-12 NOTE — Progress Notes (Signed)
Pt was discharged home today. Instructions were reviewed with patient, prescriptions were given  and questions were answered. Pt was taken to main entrance via wheelchair by NT.  

## 2016-10-14 ENCOUNTER — Telehealth: Payer: Self-pay

## 2016-10-14 NOTE — Telephone Encounter (Signed)
Pt had post op questions about using a heating pad and use of pain medication. RN disused schedule using Aleve, dilaudid and if needed tylenol for optimal pain relief. Pt stated she is using a stool softer. Will call with concerns.

## 2016-10-16 ENCOUNTER — Telehealth: Payer: Self-pay | Admitting: Gynecologic Oncology

## 2016-10-16 DIAGNOSIS — R1900 Intra-abdominal and pelvic swelling, mass and lump, unspecified site: Secondary | ICD-10-CM

## 2016-10-16 MED ORDER — HYDROMORPHONE HCL 2 MG PO TABS
2.0000 mg | ORAL_TABLET | ORAL | 0 refills | Status: DC | PRN
Start: 2016-10-16 — End: 2018-02-20

## 2016-10-16 NOTE — Telephone Encounter (Signed)
Patient called requesting a refill on dilaudid.  She states she is not having to take it as much but does not want to run out over the weekend.  Her husband is to come tomorrow and pick it up for her.  Also she states she has not had a bowel movement post-op and she has been taking the senna-kot every evening.  Advised her that she can begin taking Miralax twice daily then to once daily as needed.  Advised to continue with the stool softeners while taking the pain medication.  All questions answered.  She is to call our office for any further questions or concerns.

## 2016-11-03 NOTE — Progress Notes (Signed)
Consult Note: Gyn-Onc  Consult was requested by Dr. Elonda Husky for the evaluation of Kendra Knight 46 y.o. female  CC:  Chief Complaint  Patient presents with  . Pelvic Mass    Assessment/Plan:  [name]  is a 46 y.o.  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 Doing well post-op. No issues.   Follow-up with Dr Elonda Husky in 1 year for annual well-woman check.  HPI: Kendra Knight is a 46 year old parous woman who is seen in consultation at the request of Dr Elonda Husky for a recurrent cystic pelvic mass. 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 She had a hysterectomy approximately 13 years ago. Her surgeyr in 2004 was associated with a bowel injury to the sigmoid colon requiring resection and anastamosis.   The pathology from the 2008 surgery revealed a mass consisting of ovarian type stroma and the lumen of the cystic cavity is lined by non-atypical mucinous epithelium. A portion of the mass had areas of benign smooth muscle. The ovaries were grossly not involved or attached to the mass. 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   The patient began experiencing fullness and pressure and in October, 2017 began appreciated left sided flank pains. She was suspicious about recurrence of her cystadenoma as was seen by Dr Elonda Husky who performed CT abdo/pelvis on 07/26/16. This showed a multilobulated cystic mass with multiple thin enhancing septations in the left lower quadrant, left adnexa and central pelvis. It measured 16.8x9.6x12.1cm. 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  Interval Hx:  10/10/16: an exploratory laparotomy with LSO and lysis of adhesions was performed. Intraoperative findings included multiple  peritoneal inclusion cysts between the sigmoid colon, left ovary and side wall and cul de sac. Sigmoid colon densely adherent to left ovary and bladder. Benign frozen section of enlarged left ovary.  Right ovary not visible or palpable, possibly surgically absent. Final pathology revealed benign serous cystadenoma.    Current Meds:  Outpatient Encounter Prescriptions as of 11/04/2016  Medication Sig  . acetaminophen (TYLENOL) 500 MG tablet Take 2 tablets (1,000 mg total) by mouth every 6 (six) hours.  Marland Kitchen amoxicillin (AMOXIL) 250 MG capsule Take 1 capsule (250 mg total) by mouth 3 (three) times daily. (Patient not taking: Reported on 10/02/2016)  . cetirizine (ZYRTEC) 10 MG tablet Take 10 mg by mouth daily as needed for allergies.  . clonazePAM (KLONOPIN) 0.5 MG tablet Take 1 tablet (0.5 mg total) by mouth 2 (two) times daily as needed for anxiety.  . ferrous sulfate 325 (65 FE) MG tablet Take 1 tablet (325 mg total) by mouth 2 (two) times daily with a meal.  . HYDROmorphone (DILAUDID) 2 MG tablet Take 1 tablet (2 mg total) by mouth every 4 (four) hours as needed for severe pain.  Marland Kitchen ketorolac (TORADOL) 10 MG tablet Take 1 tablet (10 mg total) by mouth every 8 (eight) hours as needed. (Patient taking differently: Take 10 mg by mouth every 8 (eight) hours as needed for moderate pain. )  . levothyroxine (SYNTHROID, LEVOTHROID) 25 MCG tablet TAKE 1 TABLET DAILY BEFORE BREAKFAST  . naproxen sodium (ANAPROX) 220 MG tablet Take 220 mg by mouth 2 (two) times daily as needed (pain).  . pseudoephedrine (  SUDAFED) 120 MG 12 hr tablet Take 240 mg by mouth every 12 (twelve) hours as needed for congestion.  . senna-docusate (SENOKOT-S) 8.6-50 MG tablet Take 2 tablets by mouth at bedtime.  . sertraline (ZOLOFT) 100 MG tablet Take 1 tablet (100 mg total) by mouth daily.   No facility-administered encounter medications on file as of 11/04/2016.     Allergy:  Allergies  Allergen Reactions  . Buprenex  [Buprenorphine] Anaphylaxis    Pt had Acute Respiratory Failure with this drug x2  . Codeine Nausea And Vomiting  . Darvocet [Propoxyphene N-Acetaminophen] Nausea And Vomiting  . Erythromycin Nausea And Vomiting  . Hydrocodone Nausea And Vomiting  . Prednisone Other (See Comments)    Panic attacks and suicidal thoughts  . Stadol [Butorphanol] Rash    Social Hx:   Social History   Social History  . Marital status: Married    Spouse name: N/A  . Number of children: N/A  . Years of education: N/A   Occupational History  . Not on file.   Social History Main Topics  . Smoking status: Never Smoker  . Smokeless tobacco: Never Used  . Alcohol use No  . Drug use: No  . Sexual activity: Yes    Birth control/ protection: Surgical   Other Topics Concern  . Not on file   Social History Narrative  . No narrative on file    Past Surgical Hx:  Past Surgical History:  Procedure Laterality Date  . ABDOMINAL HYSTERECTOMY    . ABDOMINAL HYSTERECTOMY     ABDOMINAL HYSTERECTOMY  . CROTHERAPY    . DILATION AND CURETTAGE OF UTERUS    . LAPAROTOMY N/A 10/10/2016   Procedure: EXPLORATORY LAPAROTOMY WITH LEFT SALPINGO OPPHERECTOMY, LYSIS OF ADHESIONS;  Surgeon: Everitt Amber, MD;  Location: [facility];  Service: Gynecology;  Laterality: N/A;  . TUBAL LIGATION      Past Medical Hx:  Past Medical History:  Diagnosis Date  . Anxiety   . BV (bacterial vaginosis) 11/17/2013  . Complication of anesthesia    pt states she has woken up crying before   . Hypothyroidism   . Vaginal discharge 11/17/2013    Past Gynecological History:  Cesarean section x 2 No LMP recorded. Patient has had a hysterectomy.  Family Hx:  Family History  Problem Relation Age of Onset  . Hypertension Mother   . Heart disease Brother   . Cancer Maternal Uncle     brain  . Heart disease Maternal Grandfather   . Heart disease Paternal Grandfather     heart attack    Review of Systems:  Constitutional  Feels well,     ENT + sore throat Skin/Breast  No rash, sores, jaundice, itching, dryness Cardiovascular  No chest pain, shortness of breath, or edema  Pulmonary  No cough or wheeze.  Gastro Intestinal  No nausea, vomitting, or diarrhoea. No bright red blood per rectum, no abdominal pain, change in bowel movement, or constipation.  Genito Urinary  No frequency, urgency, dysuria, no bleeding, Musculo Skeletal  No myalgia, arthralgia, joint swelling or pain  Neurologic  No weakness, numbness, change in gait,  Psychology  No depression, anxiety, insomnia.   Vitals:  Blood pressure 134/78, pulse 70, temperature 98.2 F (36.8 C), temperature source Oral, resp. rate 20, height 5\' 3"  (1.6 m), weight 151 lb 9.6 oz (68.8 kg), SpO2 100 %.  Physical Exam: WD in NAD Neck  Mild tonsilar erythema without exudate Lymph Node Survey No cervical supraclavicular or  inguinal adenopathy Cardiovascular  Pulse normal rate, regularity and rhythm. S1 and S2 normal.  Lungs  Clear to auscultation bilateraly, without wheezes/crackles/rhonchi. Good air movement.  Skin  No rash/lesions/breakdown  Psychiatry  Alert and oriented to person, place, and time  Abdomen  Normoactive bowel sounds, abdomen soft, non-tender and thin without evidence of hernia.  Incision well healed. No infection, drainage or erythema. Back No CVA tenderness Genito Urinary  Vulva/vagina: deferred Rectal  deferred Extremities  No bilateral cyanosis, clubbing or edema.   Donaciano Eva, MD  11/04/2016, 4:06 PM

## 2016-11-04 ENCOUNTER — Ambulatory Visit: Payer: BLUE CROSS/BLUE SHIELD | Attending: Gynecologic Oncology | Admitting: Gynecologic Oncology

## 2016-11-04 ENCOUNTER — Encounter: Payer: Self-pay | Admitting: Gynecologic Oncology

## 2016-11-04 VITALS — BP 134/78 | HR 70 | Temp 98.2°F | Resp 20 | Ht 63.0 in | Wt 151.6 lb

## 2016-11-04 DIAGNOSIS — Z90710 Acquired absence of both cervix and uterus: Secondary | ICD-10-CM | POA: Insufficient documentation

## 2016-11-04 DIAGNOSIS — Z09 Encounter for follow-up examination after completed treatment for conditions other than malignant neoplasm: Secondary | ICD-10-CM | POA: Diagnosis present

## 2016-11-04 DIAGNOSIS — Z9889 Other specified postprocedural states: Secondary | ICD-10-CM | POA: Diagnosis not present

## 2016-11-04 DIAGNOSIS — R1900 Intra-abdominal and pelvic swelling, mass and lump, unspecified site: Secondary | ICD-10-CM

## 2016-11-04 DIAGNOSIS — K668 Other specified disorders of peritoneum: Secondary | ICD-10-CM

## 2016-11-04 DIAGNOSIS — D271 Benign neoplasm of left ovary: Secondary | ICD-10-CM

## 2016-11-04 NOTE — Patient Instructions (Signed)
Follow up with Dr. Elonda Husky in 1 year. See Dr. Denman George if needed again in the future.

## 2016-11-07 ENCOUNTER — Telehealth: Payer: Self-pay | Admitting: Gynecologic Oncology

## 2016-11-07 DIAGNOSIS — E28319 Asymptomatic premature menopause: Secondary | ICD-10-CM

## 2016-11-07 DIAGNOSIS — N3000 Acute cystitis without hematuria: Secondary | ICD-10-CM

## 2016-11-07 MED ORDER — NITROFURANTOIN MONOHYD MACRO 100 MG PO CAPS: 100.0000 mg | ORAL_CAPSULE | Freq: Two times a day (BID) | ORAL | 0 refills | Status: DC

## 2016-11-07 MED ORDER — ESTROGENS CONJUGATED 0.9 MG PO TABS: 0.9000 mg | ORAL_TABLET | Freq: Every day | ORAL | 11 refills | Status: DC

## 2016-11-07 NOTE — Telephone Encounter (Signed)
Patient very symptomatic with menopause. Desires estrogen replacement therapy.  Discussed risks (VTE, stroke and breast cancer with prolonged use). Discussed the advantages - symptom control, bone and vascular protection.  Premarin prescrbed. Bladder discomfort with urination - empiric macrobid prescribed.  Donaciano Eva, MD

## 2017-01-17 ENCOUNTER — Other Ambulatory Visit: Payer: Self-pay

## 2017-01-17 DIAGNOSIS — E28319 Asymptomatic premature menopause: Secondary | ICD-10-CM

## 2017-01-17 MED ORDER — ESTROGENS CONJUGATED 0.9 MG PO TABS: 0.9000 mg | ORAL_TABLET | Freq: Every day | ORAL | 3 refills | Status: DC

## 2017-03-13 ENCOUNTER — Other Ambulatory Visit: Payer: Self-pay | Admitting: *Deleted

## 2017-03-13 MED ORDER — SERTRALINE HCL 100 MG PO TABS: 100.0000 mg | ORAL_TABLET | Freq: Every day | ORAL | 3 refills | Status: DC

## 2017-05-05 ENCOUNTER — Telehealth: Payer: Self-pay | Admitting: *Deleted

## 2017-05-05 ENCOUNTER — Other Ambulatory Visit: Payer: Self-pay | Admitting: Obstetrics & Gynecology

## 2017-05-05 MED ORDER — METRONIDAZOLE 0.75 % VA GEL: VAGINAL | 3 refills | Status: DC

## 2017-05-05 NOTE — Telephone Encounter (Signed)
Spoke with pt. 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 She is requesting Metrogel. Please advise. Thanks!! [place]

## 2017-10-10 ENCOUNTER — Telehealth: Payer: Self-pay | Admitting: Obstetrics & Gynecology

## 2017-10-10 MED ORDER — CLONAZEPAM 0.5 MG PO TABS: 0.5000 mg | ORAL_TABLET | Freq: Two times a day (BID) | ORAL | 0 refills | Status: DC | PRN

## 2017-10-10 NOTE — Telephone Encounter (Signed)
Patient called stating that she would like to speak with a nurse or Dr. Elonda Husky, Patient did not state the reason why. Please contact pt

## 2017-10-10 NOTE — Telephone Encounter (Signed)
Pt called stating that she is feeling very anxious and only has 2 of her klonopin left. Pt is requesting a refill. Informed pt that Dr Glo Herring would give her a short term refill until she could get in to be seen by a provider. Pt was given the opportunity to schedule an appt as well.

## 2017-10-13 ENCOUNTER — Telehealth: Payer: Self-pay | Admitting: Obstetrics & Gynecology

## 2017-10-13 MED ORDER — CLONAZEPAM 0.5 MG PO TABS: 0.5000 mg | ORAL_TABLET | Freq: Two times a day (BID) | ORAL | 3 refills | Status: DC | PRN

## 2017-10-13 NOTE — Telephone Encounter (Signed)
Meds ordered this encounter  Medications  . clonazePAM (KLONOPIN) 0.5 MG tablet    Sig: Take 1 tablet (0.5 mg total) by mouth 2 (two) times daily as needed for anxiety.    Dispense:  60 tablet    Refill:  3

## 2017-10-28 ENCOUNTER — Ambulatory Visit: Payer: PRIVATE HEALTH INSURANCE | Admitting: Obstetrics & Gynecology

## 2017-11-11 ENCOUNTER — Ambulatory Visit: Payer: PRIVATE HEALTH INSURANCE | Admitting: Obstetrics & Gynecology

## 2018-01-08 ENCOUNTER — Telehealth: Payer: Self-pay

## 2018-01-08 NOTE — Telephone Encounter (Signed)
LM in VM stating that the refills of premarin neede to be managed through Dr. Brynda Greathouse office as she was released from this practice in March of 2018 after postoperative visit. Call if any further questions or concerns.

## 2018-01-09 ENCOUNTER — Telehealth: Payer: Self-pay | Admitting: Obstetrics & Gynecology

## 2018-01-09 NOTE — Telephone Encounter (Signed)
Patient called stating that she was referred by Dr. Elonda Husky to see another Dr. To have surgery. Pt states that she had surgery 10/2016. Pt states that she did not know that the Doctor who did her surgery is not able to do refills that she would like Dr. Elonda Husky to refill the medication. Pt has an appointment on 5/14@10 :45am to see him. Pt would like to know if she could have some until her appointment date. Please contact pt

## 2018-01-09 NOTE — Telephone Encounter (Signed)
Spoke with pt. Pt is requesting a refill on Premarin 0.9 mg takes 1 daily. Pt is requesting enough to last until she sees you. Thanks! [place]

## 2018-01-12 ENCOUNTER — Other Ambulatory Visit: Payer: Self-pay | Admitting: Obstetrics & Gynecology

## 2018-01-12 ENCOUNTER — Telehealth: Payer: Self-pay | Admitting: *Deleted

## 2018-01-12 DIAGNOSIS — E28319 Asymptomatic premature menopause: Secondary | ICD-10-CM

## 2018-01-12 MED ORDER — ESTROGENS CONJUGATED 0.9 MG PO TABS: 0.9000 mg | ORAL_TABLET | Freq: Every day | ORAL | 3 refills | Status: DC

## 2018-01-12 NOTE — Telephone Encounter (Signed)
done

## 2018-01-12 NOTE — Telephone Encounter (Signed)
Requesting refill on Premarin tablets until next visit 5/14. Please advise.

## 2018-01-20 ENCOUNTER — Encounter: Payer: Self-pay | Admitting: *Deleted

## 2018-01-20 ENCOUNTER — Ambulatory Visit: Payer: PRIVATE HEALTH INSURANCE | Admitting: Obstetrics & Gynecology

## 2018-01-23 ENCOUNTER — Ambulatory Visit: Payer: PRIVATE HEALTH INSURANCE | Admitting: Obstetrics and Gynecology

## 2018-02-06 ENCOUNTER — Ambulatory Visit: Payer: PRIVATE HEALTH INSURANCE | Admitting: Obstetrics & Gynecology

## 2018-02-20 ENCOUNTER — Ambulatory Visit (INDEPENDENT_AMBULATORY_CARE_PROVIDER_SITE_OTHER): Payer: BLUE CROSS/BLUE SHIELD | Admitting: Obstetrics & Gynecology

## 2018-02-20 ENCOUNTER — Encounter: Payer: Self-pay | Admitting: Obstetrics & Gynecology

## 2018-02-20 ENCOUNTER — Other Ambulatory Visit: Payer: Self-pay

## 2018-02-20 ENCOUNTER — Other Ambulatory Visit (HOSPITAL_COMMUNITY)
Admission: RE | Admit: 2018-02-20 | Discharge: 2018-02-20 | Disposition: A | Discharge status: Home / Self Care | Payer: BLUE CROSS/BLUE SHIELD | Source: Ambulatory Visit | Attending: Obstetrics & Gynecology | Admitting: Obstetrics & Gynecology

## 2018-02-20 VITALS — BP 146/89 | HR 51 | Ht 63.0 in | Wt 153.0 lb

## 2018-02-20 DIAGNOSIS — Z01419 Encounter for gynecological examination (general) (routine) without abnormal findings: Secondary | ICD-10-CM | POA: Insufficient documentation

## 2018-02-20 DIAGNOSIS — Z01411 Encounter for gynecological examination (general) (routine) with abnormal findings: Secondary | ICD-10-CM

## 2018-02-20 DIAGNOSIS — Z79890 Hormone replacement therapy (postmenopausal): Secondary | ICD-10-CM

## 2018-02-20 NOTE — Progress Notes (Signed)
Subjective:     Kendra Knight is a 47 y.o. female here for a routine exam.  No LMP recorded. Patient has had a hysterectomy. [id number] Birth Control Method:  hysterectomy Menstrual Calendar(currently): amenorrheic  Current complaints: none, doing great .   Current acute medical issues:  No acute   Recent Gynecologic History No LMP recorded. Patient has had a hysterectomy. Last Pap: 2017,  normal Last mammogram: ,    Past Medical History:  Diagnosis Date  . Anxiety   . BV (bacterial vaginosis) 11/17/2013  . Complication of anesthesia    pt states she has woken up crying before   . Hypothyroidism   . Vaginal discharge 11/17/2013    Past Surgical History:  Procedure Laterality Date  . ABDOMINAL HYSTERECTOMY    . ABDOMINAL HYSTERECTOMY     ABDOMINAL HYSTERECTOMY  . CROTHERAPY    . DILATION AND CURETTAGE OF UTERUS    . LAPAROTOMY N/A 10/10/2016   Procedure: EXPLORATORY LAPAROTOMY WITH LEFT SALPINGO OPPHERECTOMY, LYSIS OF ADHESIONS;  Surgeon: Everitt Amber, MD;  Location: [facility];  Service: Gynecology;  Laterality: N/A;  . TUBAL LIGATION      OB History    Gravida  5   Para  3   Term      Preterm      AB  2   Living  3     SAB  2   TAB      Ectopic      Multiple      Live Births  3           Social History   Socioeconomic History  . Marital status: Married    Spouse name: Not on file  . Number of children: Not on file  . Years of education: Not on file  . Highest education level: Not on file  Occupational History  . Not on file  Social Needs  . Financial resource strain: Not on file  . Food insecurity:    Worry: Not on file    Inability: Not on file  . Transportation needs:    Medical: Not on file    Non-medical: Not on file  Tobacco Use  . Smoking status: Never Smoker  . Smokeless tobacco: Never Used  Substance and Sexual Activity  . Alcohol use: No  . Drug use: No  . Sexual activity: Yes    Birth control/protection: Surgical  Lifestyle   . Physical activity:    Days per week: Not on file    Minutes per session: Not on file  . Stress: Not on file  Relationships  . Social connections:    Talks on phone: Not on file    Gets together: Not on file    Attends religious service: Not on file    Active member of club or organization: Not on file    Attends meetings of clubs or organizations: Not on file    Relationship status: Not on file  Other Topics Concern  . Not on file  Social History Narrative  . Not on file    Family History  Problem Relation Age of Onset  . Hypertension Mother   . Heart disease Brother   . Cancer Maternal Uncle        brain  . Heart disease Maternal Grandfather   . Heart disease Paternal Grandfather        heart attack     Current Outpatient Medications:  .  acetaminophen (TYLENOL) 500 MG tablet, Take  2 tablets (1,000 mg total) by mouth every 6 (six) hours., Disp: 30 tablet, Rfl: 0 .  cetirizine (ZYRTEC) 10 MG tablet, Take 10 mg by mouth daily as needed for allergies., Disp: , Rfl:  .  clonazePAM (KLONOPIN) 0.5 MG tablet, Take 1 tablet (0.5 mg total) by mouth 2 (two) times daily as needed for anxiety., Disp: 60 tablet, Rfl: 3 .  estrogens, conjugated, (PREMARIN) 0.9 MG tablet, Take 1 tablet (0.9 mg total) by mouth daily. Take 1 tablet daily, Disp: 90 tablet, Rfl: 3 .  levothyroxine (SYNTHROID, LEVOTHROID) 25 MCG tablet, TAKE 1 TABLET DAILY BEFORE BREAKFAST, Disp: 90 tablet, Rfl: 3 .  naproxen sodium (ANAPROX) 220 MG tablet, Take 220 mg by mouth 2 (two) times daily as needed (pain)., Disp: , Rfl:  .  sertraline (ZOLOFT) 100 MG tablet, Take 1 tablet (100 mg total) by mouth daily., Disp: 90 tablet, Rfl: 3  Review of Systems  Review of Systems  Constitutional: Negative for fever, chills, weight loss, malaise/fatigue and diaphoresis.  HENT: Negative for hearing loss, ear pain, nosebleeds, congestion, sore throat, neck pain, tinnitus and ear discharge.   Eyes: Negative for blurred vision,  double vision, photophobia, pain, discharge and redness.  Respiratory: Negative for cough, hemoptysis, sputum production, shortness of breath, wheezing and stridor.   Cardiovascular: Negative for chest pain, palpitations, orthopnea, claudication, leg swelling and PND.  Gastrointestinal: negative for abdominal pain. Negative for heartburn, nausea, vomiting, diarrhea, constipation, blood in stool and melena.  Genitourinary: Negative for dysuria, urgency, frequency, hematuria and flank pain.  Musculoskeletal: Negative for myalgias, back pain, joint pain and falls.  Skin: Negative for itching and rash.  Neurological: Negative for dizziness, tingling, tremors, sensory change, speech change, focal weakness, seizures, loss of consciousness, weakness and headaches.  Endo/Heme/Allergies: Negative for environmental allergies and polydipsia. Does not bruise/bleed easily.  Psychiatric/Behavioral: Negative for depression, suicidal ideas, hallucinations, memory loss and substance abuse. The patient is not nervous/anxious and does not have insomnia.        Objective:  Blood pressure (!) 146/89, pulse (!) 51, height 5\' 3"  (1.6 m), weight 153 lb (69.4 kg).   Physical Exam  Vitals reviewed. Constitutional: She is oriented to person, place, and time. She appears well-developed and well-nourished.  HENT:  Head: Normocephalic and atraumatic.        Right Ear: External ear normal.  Left Ear: External ear normal.  Nose: Nose normal.  Mouth/Throat: Oropharynx is clear and moist.  Eyes: Conjunctivae and EOM are normal. Pupils are equal, round, and reactive to light. Right eye exhibits no discharge. Left eye exhibits no discharge. No scleral icterus.  Neck: Normal range of motion. Neck supple. No tracheal deviation present. No thyromegaly present.  Cardiovascular: Normal rate, regular rhythm, normal heart sounds and intact distal pulses.  Exam reveals no gallop and no friction rub.   No murmur heard. Respiratory:  Effort normal and breath sounds normal. No respiratory distress. She has no wheezes. She has no rales. She exhibits no tenderness.  GI: Soft. Bowel sounds are normal. She exhibits no distension and no mass. There is no tenderness. There is no rebound and no guarding.  Genitourinary:  Breasts no masses skin changes or nipple changes bilaterally      Vulva is normal without lesions Vagina is pink moist without discharge Cervix absent Uterus is absent, there are no midline or adnexal pelvic masses palpable Adnexa is negative   Musculoskeletal: Normal range of motion. She exhibits no edema and no tenderness.  Neurological: She  is alert and oriented to person, place, and time. She has normal reflexes. She displays normal reflexes. No cranial nerve deficit. She exhibits normal muscle tone. Coordination normal.  Skin: Skin is warm and dry. No rash noted. No erythema. No pallor.  Psychiatric: She has a normal mood and affect. Her behavior is normal. Judgment and thought content normal.       Medications Ordered at today's visit: No orders of the defined types were placed in this encounter.   Other orders placed at today's visit: No orders of the defined types were placed in this encounter.     Assessment:    Healthy female exam.    Plan:    Hormone replacement therapy: hormone replacement therapy: premarin 0.9 mg. Follow up in: 1 year.    NED spindle cell tumor Return in about 1 year (around 02/21/2019) for yearly, with Dr Elonda Husky.

## 2018-02-20 NOTE — Addendum Note (Signed)
Addended by: Octaviano Glow on: 02/20/2018 01:12 PM   Modules accepted: Orders

## 2018-02-24 LAB — CYTOLOGY - PAP
Chlamydia: NEGATIVE
Diagnosis: NEGATIVE
NEISSERIA GONORRHEA: NEGATIVE

## 2018-03-27 ENCOUNTER — Other Ambulatory Visit: Payer: Self-pay | Admitting: Obstetrics & Gynecology

## 2018-07-26 IMAGING — CT CT ABD-PELV W/ CM
2 of 5 series · 16 of 46 positions shown, 18 images · IV contrast (APPLIED)
Comparison: 12/24/2006

CLINICAL DATA: Worsening left lower quadrant pain for 2 weeks.
Palpable pelvic mass.

EXAM:
CT ABDOMEN AND PELVIS WITH CONTRAST
TECHNIQUE: Multidetector CT imaging of the abdomen and pelvis was performed
using the standard protocol following bolus administration of
intravenous contrast.
CONTRAST:  100mL OLYH6Y-XKK IOPAMIDOL (OLYH6Y-XKK) INJECTION 61%

[Series 2: axial st · axial · 0.72mm/px · z∈[+1078,+1473]mm · 13 of 93 slices shown, 15 images]
[im 7/93  soft-tissue]
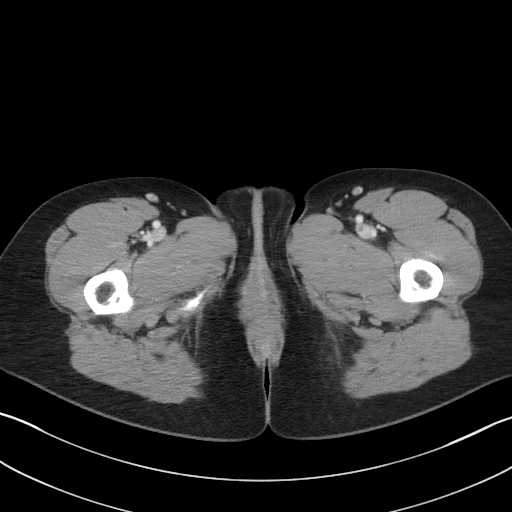
[im 7/93  bone]
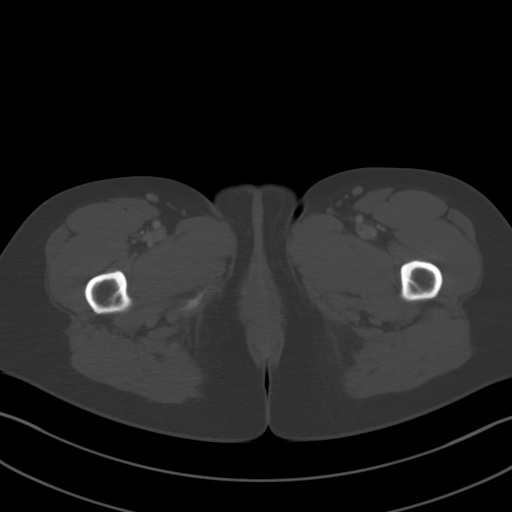
[im 14/93  soft-tissue]
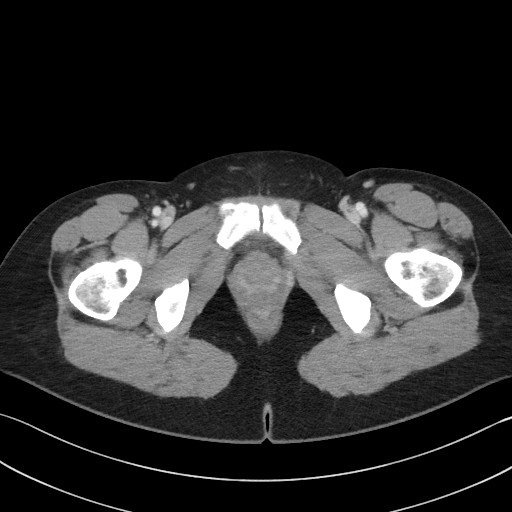
[im 20/93  soft-tissue]
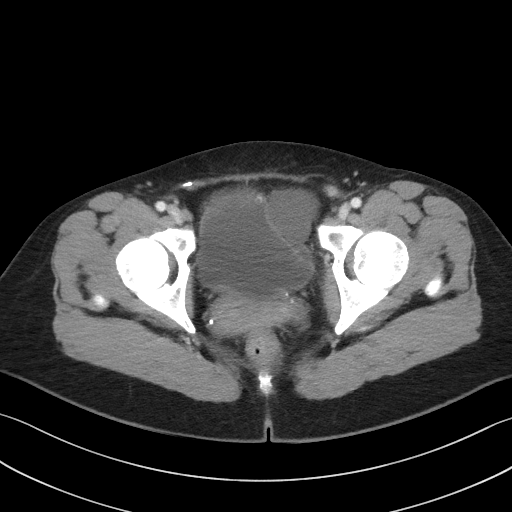
[im 27/93  soft-tissue]
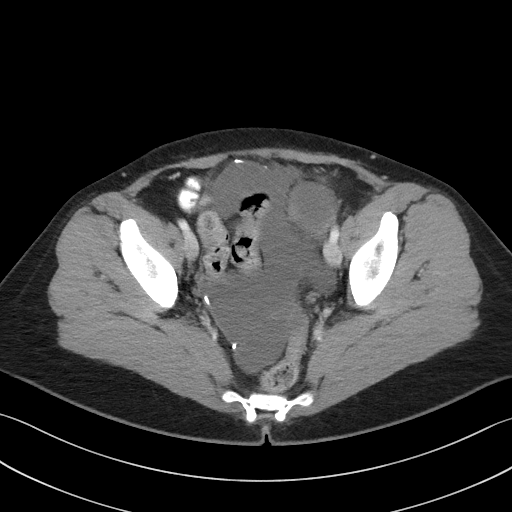
[im 33/93  soft-tissue]
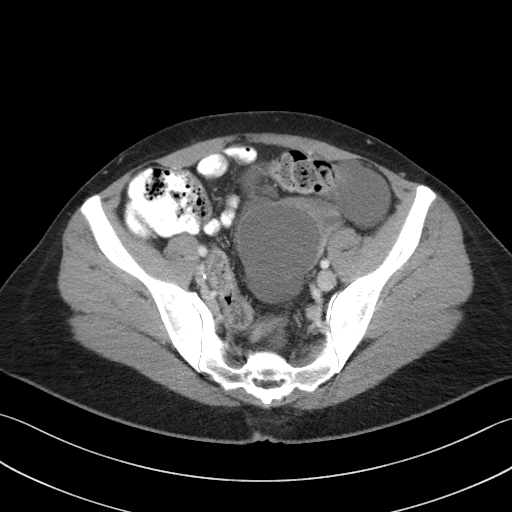
[im 40/93  soft-tissue]
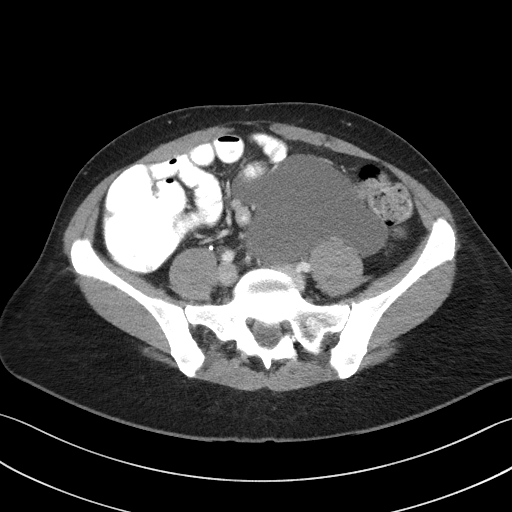
[im 47/93  soft-tissue]
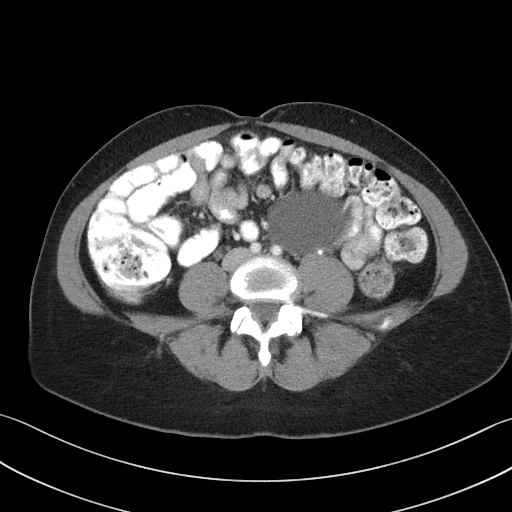
[im 53/93  soft-tissue]
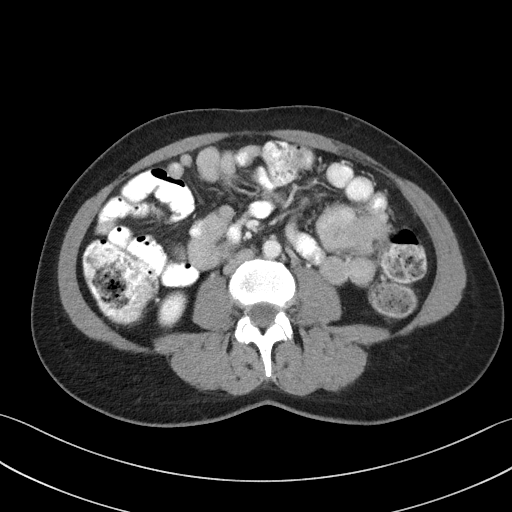
[im 60/93  soft-tissue]
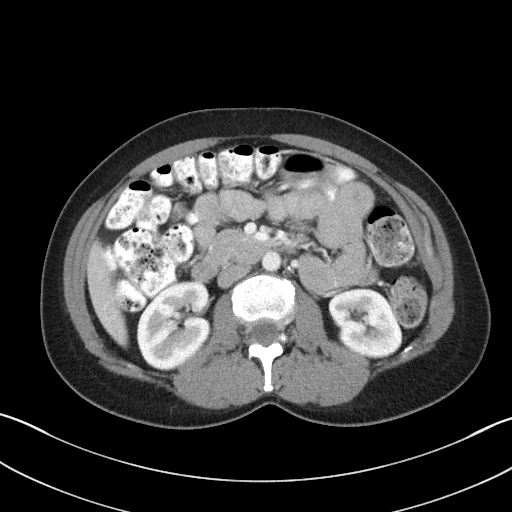
[im 60/93  bone]
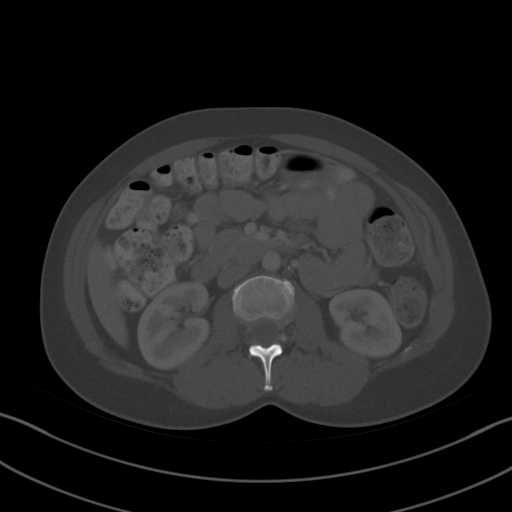
[im 66/93  soft-tissue]
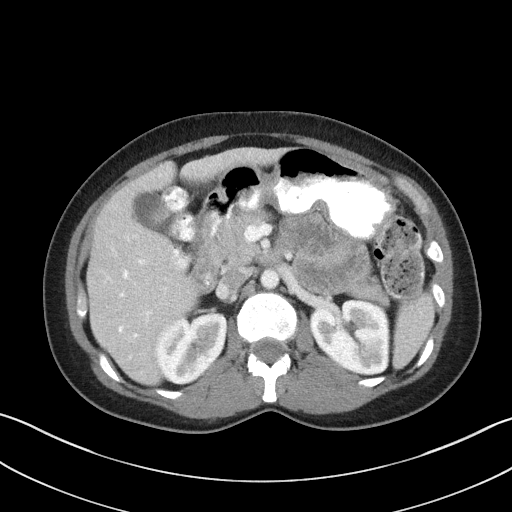
[im 73/93  soft-tissue]
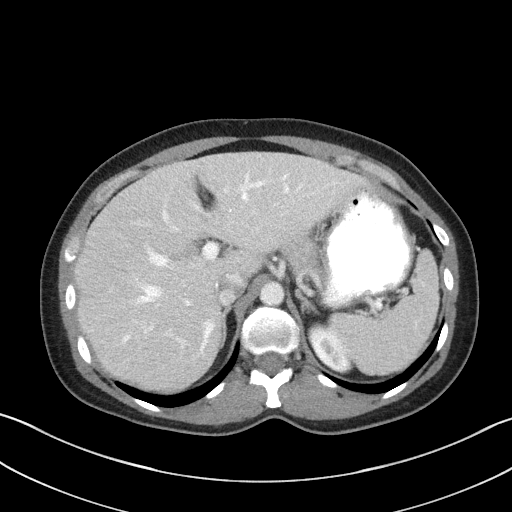
[im 79/93  soft-tissue]
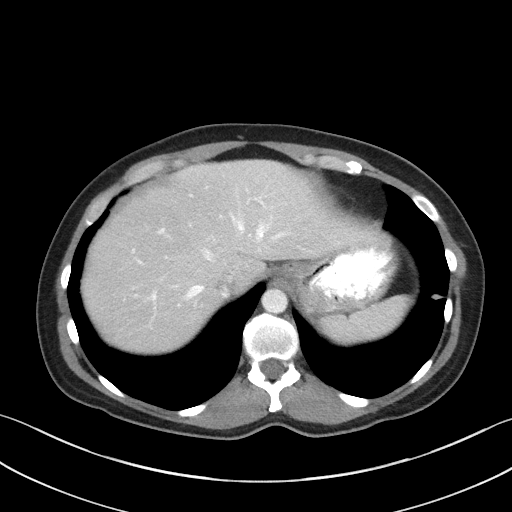
[im 86/93  soft-tissue]
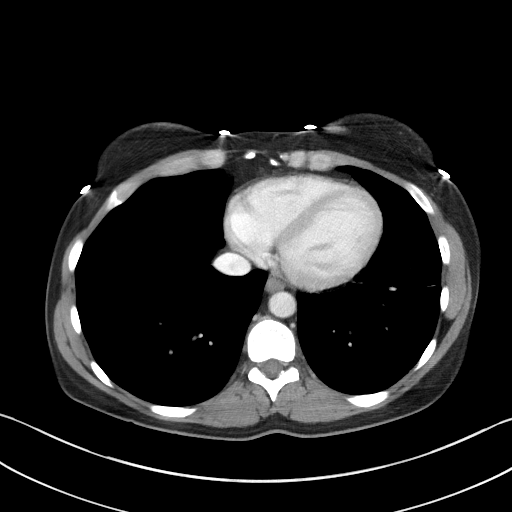

[Series 5: coronal st · coronal · 0.78mm/px · 3 of 83 slices shown]
[im 28/83  soft-tissue]
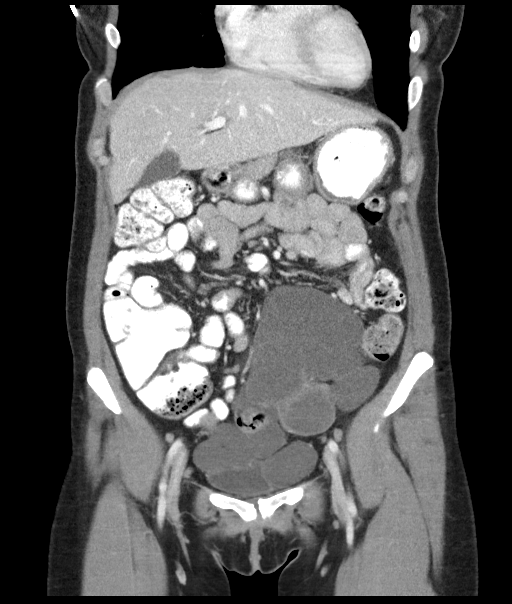
[im 37/83  soft-tissue]
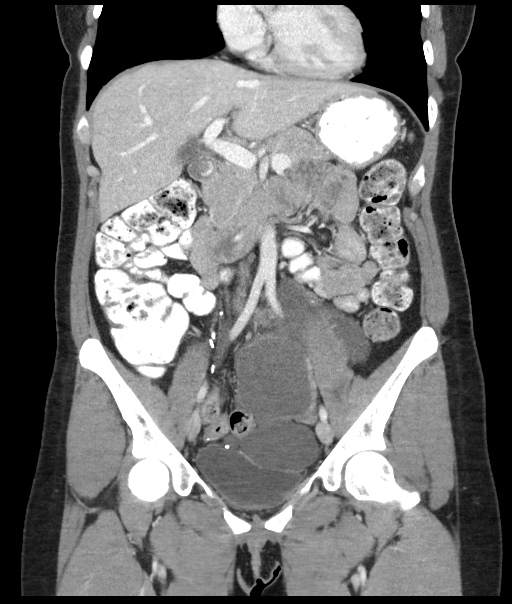
[im 46/83  soft-tissue]
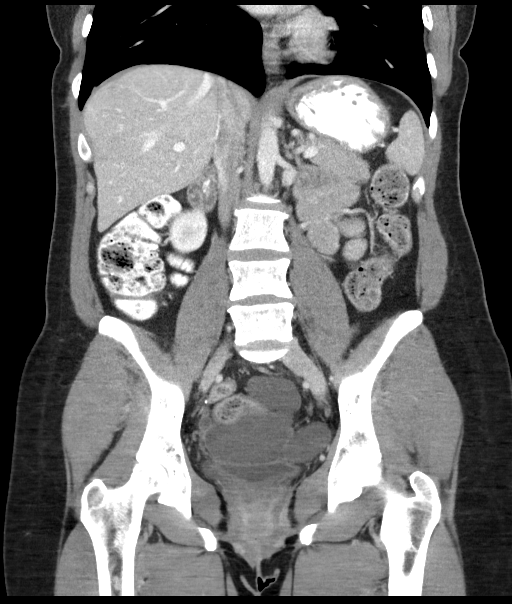

[16 of 46 positions shown; findings below may reference images not displayed]

FINDINGS: Lower Chest: No acute findings.

Hepatobiliary: No masses identified. Stable sub-cm cyst in segment
4A of the left hepatic lobe. Gallbladder is unremarkable.

Pancreas:  No mass or inflammatory changes.

Spleen: Within normal limits in size and appearance.

Adrenals/Urinary Tract: No masses identified. No evidence of
hydronephrosis. Unremarkable urinary bladder.

Stomach/Bowel: No evidence of obstruction, inflammatory process or
abnormal fluid collections.

Vascular/Lymphatic: No pathologically enlarged lymph nodes. No
abdominal aortic aneurysm.

Reproductive: Prior hysterectomy again noted. Surgical clips are
seen in the pelvis which are new since previous study. A
multilobulated cystic mass with multiple thin enhancing septations
is seen within the left lower quadrant, left adnexa, central pelvis.
In aggregate, this multilobular lesion measures 16.8 x 9.6 by
cm. This is suspicious for mucinous cystic ovarian neoplasm, likely
arising from the left ovary. Malignancy cannot be excluded. No
evidence of ascites.

Other:  None.

Musculoskeletal:  No suspicious bone lesions identified.
IMPRESSION: 17 cm multi lobulated, septated cystic mass in the left lower
quadrant, left adnexa, and central pelvis. This is suspicious for
mucinous cystic ovarian neoplasm, likely arising from the left
ovary, and malignancy cannot be excluded. Recommend correlation with
serum tumor markers, and consideration of surgical evaluation.

No evidence of ascites or abdominal metastatic disease. No evidence
of hydronephrosis.

Stable tiny hepatic cyst.

## 2019-01-13 ENCOUNTER — Other Ambulatory Visit: Payer: Self-pay | Admitting: Obstetrics & Gynecology

## 2019-01-13 DIAGNOSIS — E28319 Asymptomatic premature menopause: Secondary | ICD-10-CM

## 2019-01-14 NOTE — Telephone Encounter (Signed)
Pt checking status on refill.

## 2019-01-20 ENCOUNTER — Encounter: Payer: Self-pay | Admitting: Obstetrics & Gynecology

## 2019-01-20 NOTE — Telephone Encounter (Signed)
Pt calling to check status on med refill. She states she is out of medication.

## 2019-03-03 ENCOUNTER — Telehealth: Payer: Self-pay | Admitting: Obstetrics & Gynecology

## 2019-03-03 MED ORDER — METRONIDAZOLE 0.75 % VA GEL: VAGINAL | 0 refills | Status: DC

## 2019-03-03 NOTE — Telephone Encounter (Signed)
Pt requesting metrogel to be called in for BV.

## 2019-06-06 ENCOUNTER — Other Ambulatory Visit: Payer: Self-pay | Admitting: Obstetrics & Gynecology

## 2019-06-23 ENCOUNTER — Other Ambulatory Visit: Payer: Self-pay | Admitting: Obstetrics & Gynecology

## 2019-07-11 ENCOUNTER — Other Ambulatory Visit: Payer: Self-pay | Admitting: Obstetrics & Gynecology

## 2019-07-11 DIAGNOSIS — E28319 Asymptomatic premature menopause: Secondary | ICD-10-CM

## 2020-05-18 ENCOUNTER — Other Ambulatory Visit: Payer: Self-pay | Admitting: Obstetrics & Gynecology

## 2020-07-16 ENCOUNTER — Other Ambulatory Visit: Payer: Self-pay | Admitting: Obstetrics & Gynecology

## 2020-07-16 DIAGNOSIS — E28319 Asymptomatic premature menopause: Secondary | ICD-10-CM

## 2021-04-01 ENCOUNTER — Other Ambulatory Visit: Payer: Self-pay | Admitting: Obstetrics & Gynecology

## 2021-04-01 DIAGNOSIS — E28319 Asymptomatic premature menopause: Secondary | ICD-10-CM

## 2021-04-02 ENCOUNTER — Other Ambulatory Visit: Payer: Self-pay | Admitting: Obstetrics & Gynecology

## 2021-04-02 DIAGNOSIS — E28319 Asymptomatic premature menopause: Secondary | ICD-10-CM

## 2021-04-02 MED ORDER — ESTROGENS CONJUGATED 0.9 MG PO TABS: 0.9000 mg | ORAL_TABLET | Freq: Every day | ORAL | 11 refills | Status: AC

## 2021-04-10 ENCOUNTER — Other Ambulatory Visit: Payer: BLUE CROSS/BLUE SHIELD | Admitting: Obstetrics & Gynecology

## 2021-05-04 ENCOUNTER — Ambulatory Visit (INDEPENDENT_AMBULATORY_CARE_PROVIDER_SITE_OTHER): Payer: 59 | Admitting: Obstetrics & Gynecology

## 2021-05-04 ENCOUNTER — Encounter: Payer: Self-pay | Admitting: Obstetrics & Gynecology

## 2021-05-04 ENCOUNTER — Other Ambulatory Visit: Payer: Self-pay

## 2021-05-04 VITALS — BP 154/101 | HR 76 | Ht 63.0 in | Wt 178.5 lb

## 2021-05-04 DIAGNOSIS — Z01419 Encounter for gynecological examination (general) (routine) without abnormal findings: Secondary | ICD-10-CM

## 2021-05-04 MED ORDER — ESTRADIOL 2 MG PO TABS: 2.0000 mg | ORAL_TABLET | Freq: Every day | ORAL | 11 refills | Status: DC

## 2021-05-04 MED ORDER — CLONAZEPAM 0.5 MG PO TABS: 0.5000 mg | ORAL_TABLET | Freq: Two times a day (BID) | ORAL | 3 refills | Status: AC | PRN

## 2021-05-04 NOTE — Progress Notes (Signed)
Subjective:     Kendra Knight is a 50 y.o. female here for a routine exam.  No LMP recorded. Patient has had a hysterectomy. [id number] Birth Control Method:  hysterectomy Menstrual Calendar(currently): amenorrhea  Current complaints: none.   Current acute medical issues:     Recent Gynecologic History No LMP recorded. Patient has had a hysterectomy. Last Pap: 2019,  normal Last mammogram: 2021,  does the [place], but going to the hospital in [place] this time  Past Medical History:  Diagnosis Date   Anxiety    BV (bacterial vaginosis) [id number]   Complication of anesthesia    pt states she has woken up crying before    Hypothyroidism    Vaginal discharge 11/17/2013    Past Surgical History:  Procedure Laterality Date   ABDOMINAL HYSTERECTOMY     ABDOMINAL HYSTERECTOMY     ABDOMINAL HYSTERECTOMY   CROTHERAPY     DILATION AND CURETTAGE OF UTERUS     LAPAROTOMY N/A 10/10/2016   Procedure: EXPLORATORY LAPAROTOMY WITH LEFT SALPINGO OPPHERECTOMY, LYSIS OF ADHESIONS;  Surgeon: Everitt Amber, MD;  Location: [facility];  Service: Gynecology;  Laterality: N/A;   TUBAL LIGATION      OB History     Gravida  5   Para  3   Term      Preterm      AB  2   Living  3      SAB  2   IAB      Ectopic      Multiple      Live Births  3           Social History   Socioeconomic History   Marital status: Divorced    Spouse name: Not on file   Number of children: Not on file   Years of education: Not on file   Highest education level: Not on file  Occupational History   Not on file  Tobacco Use   Smoking status: Never   Smokeless tobacco: Never  Vaping Use   Vaping Use: Never used  Substance and Sexual Activity   Alcohol use: No   Drug use: No   Sexual activity: Yes    Birth control/protection: Surgical    Comment: hyst  Other Topics Concern   Not on file  Social History Narrative   Not on file   Social Determinants of Health   Financial Resource Strain:  Low Risk    Difficulty of Paying Living Expenses: Not very hard  Food Insecurity: No Food Insecurity   Worried About Running Out of Food in the Last Year: Never true   Ran Out of Food in the Last Year: Never true  Transportation Needs: No Transportation Needs   Lack of Transportation (Medical): No   Lack of Transportation (Non-Medical): No  Physical Activity: Insufficiently Active   Days of Exercise per Week: 1 day   Minutes of Exercise per Session: 30 min  Stress: No Stress Concern Present   Feeling of Stress : Only a little  Social Connections: Moderately Integrated   Frequency of Communication with Friends and Family: More than three times a week   Frequency of Social Gatherings with Friends and Family: Twice a week   Attends Religious Services: 1 to 4 times per year   Active Member of [employer] or Organizations: Yes   Attends Archivist Meetings: 1 to 4 times per year   Marital Status: Divorced    Family History  Problem  Relation Age of Onset   Hypertension Mother    Heart disease Brother    Cancer Maternal Uncle        brain   Heart disease Maternal Grandfather    Heart disease Paternal Grandfather        heart attack     Current Outpatient Medications:    acetaminophen (TYLENOL) 500 MG tablet, Take 2 tablets (1,000 mg total) by mouth every 6 (six) hours., Disp: 30 tablet, Rfl: 0   cetirizine (ZYRTEC) 10 MG tablet, Take 10 mg by mouth daily as needed for allergies., Disp: , Rfl:    estradiol (ESTRACE) 2 MG tablet, Take 1 tablet (2 mg total) by mouth daily., Disp: 30 tablet, Rfl: 11   estrogens, conjugated, (PREMARIN) 0.9 MG tablet, Take 1 tablet (0.9 mg total) by mouth daily. Take daily for 21 days then do not take for 7 days., Disp: 30 tablet, Rfl: 11   hydrochlorothiazide (HYDRODIURIL) 12.5 MG tablet, Take 12.5 mg by mouth daily., Disp: , Rfl:    levothyroxine (SYNTHROID) 75 MCG tablet, Take by mouth., Disp: , Rfl:    metroNIDAZOLE (METROGEL) 0.75 % vaginal gel,  USE NIGHTLY AS DIRECTED FOR 5 NIGHTS, Disp: 70 g, Rfl: 2   naproxen sodium (ANAPROX) 220 MG tablet, Take 220 mg by mouth 2 (two) times daily as needed (pain)., Disp: , Rfl:    Pantoprazole Sodium (PROTONIX PO), Take by mouth., Disp: , Rfl:    sertraline (ZOLOFT) 100 MG tablet, TAKE 1 TABLET BY MOUTH EVERY DAY, Disp: 90 tablet, Rfl: 3   clonazePAM (KLONOPIN) 0.5 MG tablet, Take 1 tablet (0.5 mg total) by mouth 2 (two) times daily as needed for anxiety., Disp: 60 tablet, Rfl: 3  Review of Systems  Review of Systems  Constitutional: Negative for fever, chills, weight loss, malaise/fatigue and diaphoresis.  HENT: Negative for hearing loss, ear pain, nosebleeds, congestion, sore throat, neck pain, tinnitus and ear discharge.   Eyes: Negative for blurred vision, double vision, photophobia, pain, discharge and redness.  Respiratory: Negative for cough, hemoptysis, sputum production, shortness of breath, wheezing and stridor.   Cardiovascular: Negative for chest pain, palpitations, orthopnea, claudication, leg swelling and PND.  Gastrointestinal: negative for abdominal pain. Negative for heartburn, nausea, vomiting, diarrhea, constipation, blood in stool and melena.  Genitourinary: Negative for dysuria, urgency, frequency, hematuria and flank pain.  Musculoskeletal: Negative for myalgias, back pain, joint pain and falls.  Skin: Negative for itching and rash.  Neurological: Negative for dizziness, tingling, tremors, sensory change, speech change, focal weakness, seizures, loss of consciousness, weakness and headaches.  Endo/Heme/Allergies: Negative for environmental allergies and polydipsia. Does not bruise/bleed easily.  Psychiatric/Behavioral: Negative for depression, suicidal ideas, hallucinations, memory loss and substance abuse. The patient is not nervous/anxious and does not have insomnia.        Objective:  Blood pressure (!) 154/101, pulse 76, height '5\' 3"'$  (1.6 m), weight 178 lb 8 oz (81 kg).    Physical Exam  Vitals reviewed. Constitutional: She is oriented to person, place, and time. She appears well-developed and well-nourished.  HENT:  Head: Normocephalic and atraumatic.        Right Ear: External ear normal.  Left Ear: External ear normal.  Nose: Nose normal.  Mouth/Throat: Oropharynx is clear and moist.  Eyes: Conjunctivae and EOM are normal. Pupils are equal, round, and reactive to light. Right eye exhibits no discharge. Left eye exhibits no discharge. No scleral icterus.  Neck: Normal range of motion. Neck supple. No tracheal deviation present.  No thyromegaly present.  Cardiovascular: Normal rate, regular rhythm, normal heart sounds and intact distal pulses.  Exam reveals no gallop and no friction rub.   No murmur heard. Respiratory: Effort normal and breath sounds normal. No respiratory distress. She has no wheezes. She has no rales. She exhibits no tenderness.  GI: Soft. Bowel sounds are normal. She exhibits no distension and no mass. There is no tenderness. There is no rebound and no guarding.  Genitourinary:  Breasts no masses skin changes or nipple changes bilaterally      Vulva is normal without lesions Vagina is pink moist without discharge Cervix absent Uterus is absent Adnexa is negative  Musculoskeletal: Normal range of motion. She exhibits no edema and no tenderness.  Neurological: She is alert and oriented to person, place, and time. She has normal reflexes. She displays normal reflexes. No cranial nerve deficit. She exhibits normal muscle tone. Coordination normal.  Skin: Skin is warm and dry. No rash noted. No erythema. No pallor.  Psychiatric: She has a normal mood and affect. Her behavior is normal. Judgment and thought content normal.       Medications Ordered at today's visit: Meds ordered this encounter  Medications   estradiol (ESTRACE) 2 MG tablet    Sig: Take 1 tablet (2 mg total) by mouth daily.    Dispense:  30 tablet    Refill:  11    clonazePAM (KLONOPIN) 0.5 MG tablet    Sig: Take 1 tablet (0.5 mg total) by mouth 2 (two) times daily as needed for anxiety.    Dispense:  60 tablet    Refill:  3    Other orders placed at today's visit: No orders of the defined types were placed in this encounter.     Assessment:    Normal Gyn exam.   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 Follow up in: 3 years.     Return in about 3 years (around 05/04/2024) for yearly.

## 2021-05-24 ENCOUNTER — Other Ambulatory Visit: Payer: Self-pay | Admitting: Obstetrics & Gynecology

## 2021-05-31 ENCOUNTER — Other Ambulatory Visit: Payer: Self-pay | Admitting: Obstetrics & Gynecology

## 2021-06-04 ENCOUNTER — Other Ambulatory Visit: Payer: Self-pay

## 2021-06-04 ENCOUNTER — Telehealth: Payer: Self-pay | Admitting: Obstetrics & Gynecology

## 2021-06-04 NOTE — Telephone Encounter (Signed)
Called pharmacy to verify prescription status. They stated they hadn't received the new prescription. Gave verbal order based on new prescription in chart.  Called pt to inform her of prescription status and to call pharmacy to find out when to pick it up. Pt confirmed understanding.

## 2021-06-04 NOTE — Telephone Encounter (Signed)
Pt called to say that the pharmacy told her they do not have the refill of her Zoloft that Dr. Elonda Husky ordered on 05/25/21 - can we resend?  Please advise & notify pt

## 2021-06-10 ENCOUNTER — Other Ambulatory Visit: Payer: Self-pay | Admitting: Obstetrics & Gynecology

## 2022-05-26 ENCOUNTER — Other Ambulatory Visit: Payer: Self-pay | Admitting: Obstetrics & Gynecology

## 2022-08-03 ENCOUNTER — Other Ambulatory Visit: Payer: Self-pay | Admitting: Obstetrics & Gynecology

## 2023-08-26 ENCOUNTER — Other Ambulatory Visit: Payer: Self-pay | Admitting: Obstetrics & Gynecology

## 2024-08-29 ENCOUNTER — Other Ambulatory Visit: Payer: Self-pay | Admitting: Obstetrics & Gynecology

## 2024-09-07 ENCOUNTER — Telehealth: Payer: Self-pay | Admitting: Obstetrics & Gynecology

## 2024-09-07 NOTE — Telephone Encounter (Signed)
 Patient calling stating that she is out of zoloft  for 8 days now and that the pharmacy states that they sent in a refill request. Patient is asking for referral. She is not due for visit until August.

## 2024-09-07 NOTE — Telephone Encounter (Signed)
 Called patient back we had not filled her sertraline  for 3 years. Pt realized that she had been getting it from  her PCP.

## 2024-10-11 ENCOUNTER — Encounter: Admitting: Adult Health

## 2024-10-19 ENCOUNTER — Encounter: Admitting: Obstetrics & Gynecology
